# Patient Record
Sex: Female | Born: 1965
Health system: Southern US, Community
[De-identification: ages and names within clinical notes are randomized; demographics above are authoritative.]

## PROBLEM LIST (undated history)

## (undated) HISTORY — PX: BREAST BIOPSY: SHX20

## (undated) HISTORY — PX: REDUCTION MAMMAPLASTY: SUR839

---

## 1998-07-06 ENCOUNTER — Other Ambulatory Visit: Admission: RE | Admit: 1998-07-06 | Discharge: 1998-07-06 | Payer: Self-pay | Admitting: Obstetrics and Gynecology

## 1999-08-30 ENCOUNTER — Other Ambulatory Visit: Admission: RE | Admit: 1999-08-30 | Discharge: 1999-08-30 | Payer: Self-pay | Admitting: Obstetrics and Gynecology

## 2002-07-14 ENCOUNTER — Other Ambulatory Visit: Admission: RE | Admit: 2002-07-14 | Discharge: 2002-07-14 | Payer: Self-pay | Admitting: Obstetrics and Gynecology

## 2003-07-21 ENCOUNTER — Other Ambulatory Visit: Admission: RE | Admit: 2003-07-21 | Discharge: 2003-07-21 | Payer: Self-pay | Admitting: Obstetrics and Gynecology

## 2004-11-22 ENCOUNTER — Other Ambulatory Visit: Admission: RE | Admit: 2004-11-22 | Discharge: 2004-11-22 | Payer: Self-pay | Admitting: Obstetrics and Gynecology

## 2006-02-09 ENCOUNTER — Other Ambulatory Visit: Admission: RE | Admit: 2006-02-09 | Discharge: 2006-02-09 | Payer: Self-pay | Admitting: Obstetrics and Gynecology

## 2009-06-09 ENCOUNTER — Ambulatory Visit (HOSPITAL_BASED_OUTPATIENT_CLINIC_OR_DEPARTMENT_OTHER): Admission: RE | Admit: 2009-06-09 | Discharge: 2009-06-09 | Payer: Self-pay | Admitting: Surgery

## 2010-10-26 LAB — POCT HEMOGLOBIN-HEMACUE: Hemoglobin: 13.5 g/dL (ref 12.0–15.0)

## 2013-08-27 ENCOUNTER — Other Ambulatory Visit: Payer: Self-pay | Admitting: Obstetrics and Gynecology

## 2013-08-27 DIAGNOSIS — R928 Other abnormal and inconclusive findings on diagnostic imaging of breast: Secondary | ICD-10-CM

## 2013-09-08 ENCOUNTER — Ambulatory Visit
Admission: RE | Admit: 2013-09-08 | Discharge: 2013-09-08 | Disposition: A | Discharge status: Home / Self Care | Payer: BC Managed Care – PPO | Source: Ambulatory Visit | Attending: Obstetrics and Gynecology | Admitting: Obstetrics and Gynecology

## 2013-09-08 DIAGNOSIS — R928 Other abnormal and inconclusive findings on diagnostic imaging of breast: Secondary | ICD-10-CM

## 2014-02-23 ENCOUNTER — Other Ambulatory Visit: Payer: Self-pay | Admitting: Obstetrics and Gynecology

## 2014-02-23 DIAGNOSIS — N6320 Unspecified lump in the left breast, unspecified quadrant: Secondary | ICD-10-CM

## 2014-03-10 ENCOUNTER — Encounter (INDEPENDENT_AMBULATORY_CARE_PROVIDER_SITE_OTHER): Payer: Self-pay

## 2014-03-10 ENCOUNTER — Ambulatory Visit
Admission: RE | Admit: 2014-03-10 | Discharge: 2014-03-10 | Disposition: A | Discharge status: Home / Self Care | Payer: BC Managed Care – PPO | Source: Ambulatory Visit | Attending: Obstetrics and Gynecology | Admitting: Obstetrics and Gynecology

## 2014-03-10 DIAGNOSIS — N6320 Unspecified lump in the left breast, unspecified quadrant: Secondary | ICD-10-CM

## 2014-09-21 ENCOUNTER — Other Ambulatory Visit: Payer: Self-pay | Admitting: Obstetrics and Gynecology

## 2014-09-21 DIAGNOSIS — N63 Unspecified lump in unspecified breast: Secondary | ICD-10-CM

## 2014-09-29 ENCOUNTER — Ambulatory Visit
Admission: RE | Admit: 2014-09-29 | Discharge: 2014-09-29 | Disposition: A | Discharge status: Home / Self Care | Payer: BLUE CROSS/BLUE SHIELD | Source: Ambulatory Visit | Attending: Obstetrics and Gynecology | Admitting: Obstetrics and Gynecology

## 2014-09-29 DIAGNOSIS — N63 Unspecified lump in unspecified breast: Secondary | ICD-10-CM

## 2015-12-09 ENCOUNTER — Other Ambulatory Visit: Payer: Self-pay | Admitting: Obstetrics and Gynecology

## 2015-12-09 DIAGNOSIS — N6320 Unspecified lump in the left breast, unspecified quadrant: Secondary | ICD-10-CM

## 2015-12-15 ENCOUNTER — Ambulatory Visit
Admission: RE | Admit: 2015-12-15 | Discharge: 2015-12-15 | Disposition: A | Discharge status: Home / Self Care | Payer: BLUE CROSS/BLUE SHIELD | Source: Ambulatory Visit | Attending: Obstetrics and Gynecology | Admitting: Obstetrics and Gynecology

## 2015-12-15 DIAGNOSIS — N6320 Unspecified lump in the left breast, unspecified quadrant: Secondary | ICD-10-CM

## 2017-01-18 ENCOUNTER — Other Ambulatory Visit: Payer: Self-pay | Admitting: Obstetrics and Gynecology

## 2017-01-18 DIAGNOSIS — Z1231 Encounter for screening mammogram for malignant neoplasm of breast: Secondary | ICD-10-CM

## 2017-02-01 ENCOUNTER — Ambulatory Visit: Payer: BLUE CROSS/BLUE SHIELD

## 2017-02-15 ENCOUNTER — Ambulatory Visit
Admission: RE | Admit: 2017-02-15 | Discharge: 2017-02-15 | Disposition: A | Discharge status: Home / Self Care | Payer: BLUE CROSS/BLUE SHIELD | Source: Ambulatory Visit | Attending: Obstetrics and Gynecology | Admitting: Obstetrics and Gynecology

## 2017-02-15 ENCOUNTER — Encounter (INDEPENDENT_AMBULATORY_CARE_PROVIDER_SITE_OTHER): Payer: Self-pay

## 2017-02-15 DIAGNOSIS — Z1231 Encounter for screening mammogram for malignant neoplasm of breast: Secondary | ICD-10-CM

## 2017-05-15 LAB — HM PAP SMEAR

## 2017-10-24 DIAGNOSIS — Z139 Encounter for screening, unspecified: Secondary | ICD-10-CM | POA: Diagnosis not present

## 2018-04-02 DIAGNOSIS — M79672 Pain in left foot: Secondary | ICD-10-CM | POA: Diagnosis not present

## 2018-04-02 DIAGNOSIS — M7732 Calcaneal spur, left foot: Secondary | ICD-10-CM | POA: Diagnosis not present

## 2018-04-26 DIAGNOSIS — M25572 Pain in left ankle and joints of left foot: Secondary | ICD-10-CM | POA: Diagnosis not present

## 2018-05-10 ENCOUNTER — Other Ambulatory Visit: Payer: Self-pay | Admitting: Obstetrics and Gynecology

## 2018-05-10 DIAGNOSIS — Z1231 Encounter for screening mammogram for malignant neoplasm of breast: Secondary | ICD-10-CM

## 2018-05-14 DIAGNOSIS — N9089 Other specified noninflammatory disorders of vulva and perineum: Secondary | ICD-10-CM | POA: Diagnosis not present

## 2018-05-14 DIAGNOSIS — R300 Dysuria: Secondary | ICD-10-CM | POA: Diagnosis not present

## 2018-05-14 DIAGNOSIS — N898 Other specified noninflammatory disorders of vagina: Secondary | ICD-10-CM | POA: Diagnosis not present

## 2018-05-20 DIAGNOSIS — Z1389 Encounter for screening for other disorder: Secondary | ICD-10-CM | POA: Diagnosis not present

## 2018-05-20 DIAGNOSIS — Z6832 Body mass index (BMI) 32.0-32.9, adult: Secondary | ICD-10-CM | POA: Diagnosis not present

## 2018-05-20 DIAGNOSIS — Z01419 Encounter for gynecological examination (general) (routine) without abnormal findings: Secondary | ICD-10-CM | POA: Diagnosis not present

## 2018-05-20 DIAGNOSIS — Z124 Encounter for screening for malignant neoplasm of cervix: Secondary | ICD-10-CM | POA: Diagnosis not present

## 2018-05-21 DIAGNOSIS — Z124 Encounter for screening for malignant neoplasm of cervix: Secondary | ICD-10-CM | POA: Diagnosis not present

## 2018-05-21 DIAGNOSIS — Z01419 Encounter for gynecological examination (general) (routine) without abnormal findings: Secondary | ICD-10-CM | POA: Diagnosis not present

## 2018-06-17 ENCOUNTER — Ambulatory Visit: Payer: BLUE CROSS/BLUE SHIELD

## 2018-08-01 ENCOUNTER — Ambulatory Visit
Admission: RE | Admit: 2018-08-01 | Discharge: 2018-08-01 | Disposition: A | Discharge status: Home / Self Care | Payer: BLUE CROSS/BLUE SHIELD | Source: Ambulatory Visit | Attending: Obstetrics and Gynecology | Admitting: Obstetrics and Gynecology

## 2018-08-01 DIAGNOSIS — Z1231 Encounter for screening mammogram for malignant neoplasm of breast: Secondary | ICD-10-CM

## 2018-08-02 ENCOUNTER — Other Ambulatory Visit: Payer: Self-pay | Admitting: Obstetrics and Gynecology

## 2018-08-02 DIAGNOSIS — R928 Other abnormal and inconclusive findings on diagnostic imaging of breast: Secondary | ICD-10-CM

## 2018-08-07 ENCOUNTER — Ambulatory Visit: Payer: BLUE CROSS/BLUE SHIELD

## 2018-08-07 ENCOUNTER — Ambulatory Visit
Admission: RE | Admit: 2018-08-07 | Discharge: 2018-08-07 | Disposition: A | Discharge status: Home / Self Care | Payer: BLUE CROSS/BLUE SHIELD | Source: Ambulatory Visit | Attending: Obstetrics and Gynecology | Admitting: Obstetrics and Gynecology

## 2018-08-07 DIAGNOSIS — R928 Other abnormal and inconclusive findings on diagnostic imaging of breast: Secondary | ICD-10-CM

## 2019-06-12 DIAGNOSIS — Z20828 Contact with and (suspected) exposure to other viral communicable diseases: Secondary | ICD-10-CM | POA: Diagnosis not present

## 2019-06-23 DIAGNOSIS — Z1389 Encounter for screening for other disorder: Secondary | ICD-10-CM | POA: Diagnosis not present

## 2019-06-23 DIAGNOSIS — Z01419 Encounter for gynecological examination (general) (routine) without abnormal findings: Secondary | ICD-10-CM | POA: Diagnosis not present

## 2019-06-23 DIAGNOSIS — Z6834 Body mass index (BMI) 34.0-34.9, adult: Secondary | ICD-10-CM | POA: Diagnosis not present

## 2019-06-23 DIAGNOSIS — Z130 Encounter for screening for diseases of the blood and blood-forming organs and certain disorders involving the immune mechanism: Secondary | ICD-10-CM | POA: Diagnosis not present

## 2019-06-25 DIAGNOSIS — Z01419 Encounter for gynecological examination (general) (routine) without abnormal findings: Secondary | ICD-10-CM | POA: Diagnosis not present

## 2019-07-02 ENCOUNTER — Telehealth: Payer: Self-pay | Admitting: Internal Medicine

## 2019-07-02 NOTE — Telephone Encounter (Signed)
Patient is requesting to establish care with you. Would you be willing to see her as a new patient?  See message below.

## 2019-07-02 NOTE — Telephone Encounter (Signed)
Copied from [facility] [id number]. Topic: Appointment Scheduling - Scheduling Inquiry for Clinic >> Jul 02, 2019 11:08 AM Rainey Pines A wrote: Patient stated that her current primary doctor referred her to Dr.Burns as a new patient. Advised patient that Dr. Quay Burow wasn't accepting new patients. Patient insisted message be sent to see if an exception can be made based off referral. Please advise

## 2019-07-03 NOTE — Telephone Encounter (Signed)
I will accept her 

## 2019-07-04 NOTE — Telephone Encounter (Signed)
Appointment scheduled.

## 2019-07-08 ENCOUNTER — Other Ambulatory Visit: Payer: Self-pay

## 2019-07-08 ENCOUNTER — Encounter: Payer: Self-pay | Admitting: Internal Medicine

## 2019-07-08 ENCOUNTER — Ambulatory Visit (INDEPENDENT_AMBULATORY_CARE_PROVIDER_SITE_OTHER): Payer: BC Managed Care – PPO | Admitting: Internal Medicine

## 2019-07-08 DIAGNOSIS — R7989 Other specified abnormal findings of blood chemistry: Secondary | ICD-10-CM | POA: Insufficient documentation

## 2019-07-08 DIAGNOSIS — K219 Gastro-esophageal reflux disease without esophagitis: Secondary | ICD-10-CM

## 2019-07-08 DIAGNOSIS — R7303 Prediabetes: Secondary | ICD-10-CM

## 2019-07-08 DIAGNOSIS — F419 Anxiety disorder, unspecified: Secondary | ICD-10-CM

## 2019-07-08 DIAGNOSIS — E782 Mixed hyperlipidemia: Secondary | ICD-10-CM | POA: Diagnosis not present

## 2019-07-08 DIAGNOSIS — E569 Vitamin deficiency, unspecified: Secondary | ICD-10-CM

## 2019-07-08 DIAGNOSIS — E1169 Type 2 diabetes mellitus with other specified complication: Secondary | ICD-10-CM | POA: Insufficient documentation

## 2019-07-08 DIAGNOSIS — E785 Hyperlipidemia, unspecified: Secondary | ICD-10-CM | POA: Insufficient documentation

## 2019-07-08 DIAGNOSIS — F5101 Primary insomnia: Secondary | ICD-10-CM

## 2019-07-08 DIAGNOSIS — G4700 Insomnia, unspecified: Secondary | ICD-10-CM | POA: Insufficient documentation

## 2019-07-08 DIAGNOSIS — E559 Vitamin D deficiency, unspecified: Secondary | ICD-10-CM | POA: Insufficient documentation

## 2019-07-08 DIAGNOSIS — E119 Type 2 diabetes mellitus without complications: Secondary | ICD-10-CM | POA: Insufficient documentation

## 2019-07-08 MED ORDER — FAMOTIDINE 20 MG PO TABS: 20.0000 mg | ORAL_TABLET | Freq: Every day | ORAL | Status: DC

## 2019-07-08 MED ORDER — VENLAFAXINE HCL ER 37.5 MG PO CP24: 37.5000 mg | ORAL_CAPSULE | Freq: Every day | ORAL | 5 refills | Status: DC

## 2019-07-08 NOTE — Assessment & Plan Note (Signed)
New diagnosis Has been experiencing increased anxiety and has had some chest pressure which is likely related to the anxiety We will start Effexor 37.5 mg daily Discussed possible side effects Follow-up in office in 2 months, sooner if needed Encouraged regular exercise

## 2019-07-08 NOTE — Assessment & Plan Note (Signed)
Taking high-dose vitamin D-prescribed by GYN Advised to start taking 2000 units of vitamin D daily once she completes this

## 2019-07-08 NOTE — Assessment & Plan Note (Signed)
Primary insomnia Been prescribed Ambien by her gynecologist-takes 5 mg  4-5 days a week Insomnia worse since menopause GYN will continue to prescribe, but if they want me to prescribe I will

## 2019-07-08 NOTE — Assessment & Plan Note (Signed)
TSH elevated at [place] office recently-it was 6.13 ?  Subclinical hypothyroidism Discussed possibly starting medication versus rechecking TSH We will hold off on medication Recheck TSH in 2 months

## 2019-07-08 NOTE — Assessment & Plan Note (Signed)
A1c done by GYN was 6.2 Encourage regular exercise We will work on weight loss

## 2019-07-08 NOTE — Progress Notes (Signed)
Subjective:    Patient ID: Victoria Stout, female    DOB: 24-Jan-1966, 53 y.o.   MRN: [id number]  HPI  She is here to establish with a new pcp.    She does walk for exercise - at least one mile three times a week.   Chest pressure:  She has had chest pressure that she has had for a onmth ago.  There is no pattern to when it comes.  It is worse when she gets overwhelmed. She does not feel it when she is distracted - she feels it when she sits and thinks about things.    Anxiety:  She worries a lot.  She is more emotional.  She feels self conscious.  She was unsure if the chest pressure was related to anxiety or not.  Elevated TSH: She had recent blood work done by her gynecologist and was advised to follow-up with a PCP.  She has had difficulty losing weight.  Her hair has gotten a little bit thinner and she was not sure if that was related to the thyroid or menopause.  Prediabetes: This is not new.  Her sugar was elevated with her recent blood work.  She is doing some exercise.  She does eat more sweets than she should.  Hyperlipidemia: She has had slightly elevated cholesterol.  She is trying to control it with lifestyle.  Medications and allergies reviewed with patient and updated if appropriate.  Patient Active Problem List   Diagnosis Date Noted  . Vitamin deficiency 07/08/2019  . Prediabetes 07/08/2019  . Elevated TSH 07/08/2019  . Hyperlipidemia 07/08/2019  . Insomnia 07/08/2019  . GERD (gastroesophageal reflux disease) 07/08/2019  . Anxiety 07/08/2019    Current Outpatient Medications on File Prior to Visit  Medication Sig Dispense Refill  . Vitamin D, Ergocalciferol, (DRISDOL) 1.25 MG (50000 UT) CAPS capsule Take 50,000 Units by mouth every 7 (seven) days.    Marland Kitchen zolpidem (AMBIEN) 10 MG tablet Take 10 mg by mouth at bedtime as needed for sleep.     No current facility-administered medications on file prior to visit.    History reviewed. No pertinent past medical  history.  Past Surgical History:  Procedure Laterality Date  . BREAST BIOPSY Left   . REDUCTION MAMMAPLASTY Bilateral     Social History   Socioeconomic History  . Marital status: Single    Spouse name: Not on file  . Number of children: Not on file  . Years of education: Not on file  . Highest education level: Not on file  Occupational History  . Not on file  Tobacco Use  . Smoking status: Never Smoker  . Smokeless tobacco: Never Used  Substance and Sexual Activity  . Alcohol use: Not Currently  . Drug use: Never  . Sexual activity: Yes    Partners: Male  Other Topics Concern  . Not on file  Social History Narrative  . Not on file   Social Determinants of Health   Financial Resource Strain:   . Difficulty of Paying Living Expenses: Not on file  Food Insecurity:   . Worried About Charity fundraiser in the Last Year: Not on file  . Ran Out of Food in the Last Year: Not on file  Transportation Needs:   . Lack of Transportation (Medical): Not on file  . Lack of Transportation (Non-Medical): Not on file  Physical Activity:   . Days of Exercise per Week: Not on file  . Minutes of  Exercise per Session: Not on file  Stress:   . Feeling of Stress : Not on file  Social Connections:   . Frequency of Communication with Friends and Family: Not on file  . Frequency of Social Gatherings with Friends and Family: Not on file  . Attends Religious Services: Not on file  . Active Member of Clubs or Organizations: Not on file  . Attends Banker Meetings: Not on file  . Marital Status: Not on file    Family History  Problem Relation Age of Onset  . Cancer Mother   . Hypertension Mother   . Hyperlipidemia Mother   . Diabetes Father   . Cancer Father   . Hypertension Father   . Diabetes Sister   . Diabetes Brother   . Breast cancer Neg Hx     Review of Systems  Constitutional: Negative for chills and fever.       Hot flashes  Respiratory: Negative for  cough, shortness of breath and wheezing.   Cardiovascular: Positive for chest pain (chest pressure) and palpitations. Negative for leg swelling.  Gastrointestinal: Negative for abdominal pain, blood in stool, constipation, diarrhea and nausea.       GERD  Genitourinary: Negative for dysuria and hematuria.  Musculoskeletal: Positive for back pain (lower back) and myalgias (legs ache sometimes). Negative for arthralgias.  Neurological: Positive for headaches (occ). Negative for light-headedness.  Psychiatric/Behavioral: Positive for dysphoric mood (sometimes) and sleep disturbance. The patient is nervous/anxious.        Objective:   Vitals:   07/08/19 1055  BP: (!) 142/78  Pulse: 74  Resp: 16  Temp: 98.4 F (36.9 C)  SpO2: 99%   BP Readings from Last 3 Encounters:  07/08/19 (!) 142/78   Wt Readings from Last 3 Encounters:  07/08/19 212 lb (96.2 kg)   Body mass index is 36.39 kg/m.   Physical Exam Constitutional:      General: She is not in acute distress.    Appearance: Normal appearance. She is not ill-appearing.  HENT:     Head: Normocephalic and atraumatic.  Eyes:     Conjunctiva/sclera: Conjunctivae normal.  Neck:     Vascular: No carotid bruit.  Cardiovascular:     Rate and Rhythm: Normal rate and regular rhythm.     Pulses: Normal pulses.     Heart sounds: No murmur.  Pulmonary:     Effort: Pulmonary effort is normal. No respiratory distress.     Breath sounds: Normal breath sounds. No wheezing or rales.  Abdominal:     General: There is no distension.     Palpations: Abdomen is soft.     Tenderness: There is no abdominal tenderness.  Musculoskeletal:     Cervical back: Neck supple. No tenderness.     Right lower leg: No edema.     Left lower leg: No edema.  Lymphadenopathy:     Cervical: No cervical adenopathy.  Skin:    General: Skin is warm and dry.  Neurological:     Mental Status: She is alert.  Psychiatric:        Behavior: Behavior normal.          Thought Content: Thought content normal.        Judgment: Judgment normal.     Comments: Slightly anxious mood       .     Assessment & Plan:    See Problem List for Assessment and Plan of chronic medical problems.   This  visit occurred during the SARS-CoV-2 public health emergency.  Safety protocols were in place, including screening questions prior to the visit, additional usage of staff PPE, and extensive cleaning of exam room while observing appropriate contact time as indicated for disinfecting solutions.

## 2019-07-08 NOTE — Assessment & Plan Note (Signed)
Cholesterol elevated-recent LDL at [place] office 147, triglycerides 179, HDL 65, total cholesterol 244 Advised regular exercise Working on weight loss Can consider rechecking in a couple of months

## 2019-07-08 NOTE — Patient Instructions (Addendum)
   Medications reviewed and updated.  Changes include :   Effexor 37.5 mg daily  Your prescription(s) have been submitted to your pharmacy. Please take as directed and contact our office if you believe you are having problem(s) with the medication(s).   Please followup in 2 months

## 2019-07-08 NOTE — Assessment & Plan Note (Signed)
She started taking Pepcid 20 mg daily and will take daily for 2 weeks and then see if she needs it regularly or can take it as needed

## 2019-07-31 ENCOUNTER — Other Ambulatory Visit: Payer: Self-pay | Admitting: Internal Medicine

## 2019-08-03 ENCOUNTER — Encounter: Payer: Self-pay | Admitting: Internal Medicine

## 2019-09-07 NOTE — Progress Notes (Signed)
Subjective:    Patient ID: Victoria Stout, female    DOB: [birth year], 54 y.o.   MRN: [id number]  HPI The patient is here for follow up of their chronic medical problems, including anxiety, elevated tsh, prediabetes, GERD   We started effexor two months ago.  She denies any side effects.  She initially did not feel any different, but realized that she was less anxious and was not as irritable.  She does feel like the medication has helped.  She does feel anxious or stressed at times, but feels that may be normal.  She denies depression.  She is not exercising regularly.       Medications and allergies reviewed with patient and updated if appropriate.  Patient Active Problem List   Diagnosis Date Noted  . Vitamin deficiency 07/08/2019  . Prediabetes 07/08/2019  . Elevated TSH 07/08/2019  . Hyperlipidemia 07/08/2019  . Insomnia 07/08/2019  . GERD (gastroesophageal reflux disease) 07/08/2019  . Anxiety 07/08/2019    Current Outpatient Medications on File Prior to Visit  Medication Sig Dispense Refill  . omeprazole (PRILOSEC) 20 MG capsule Take 20 mg by mouth daily.    Marland Kitchen venlafaxine XR (EFFEXOR-XR) 37.5 MG 24 hr capsule TAKE 1 CAPSULE BY MOUTH DAILY WITH BREAKFAST. 90 capsule 2  . Vitamin D, Ergocalciferol, (DRISDOL) 1.25 MG (50000 UT) CAPS capsule Take 50,000 Units by mouth every 7 (seven) days.    Marland Kitchen zolpidem (AMBIEN) 10 MG tablet Take 10 mg by mouth at bedtime as needed for sleep.     No current facility-administered medications on file prior to visit.    History reviewed. No pertinent past medical history.  Past Surgical History:  Procedure Laterality Date  . BREAST BIOPSY Left   . REDUCTION MAMMAPLASTY Bilateral     Social History   Socioeconomic History  . Marital status: Single    Spouse name: Not on file  . Number of children: Not on file  . Years of education: Not on file  . Highest education level: Not on file  Occupational History  . Not on file    Tobacco Use  . Smoking status: Never Smoker  . Smokeless tobacco: Never Used  Substance and Sexual Activity  . Alcohol use: Not Currently  . Drug use: Never  . Sexual activity: Yes    Partners: Male  Other Topics Concern  . Not on file  Social History Narrative  . Not on file   Social Determinants of Health   Financial Resource Strain:   . Difficulty of Paying Living Expenses: Not on file  Food Insecurity:   . Worried About Charity fundraiser in the Last Year: Not on file  . Ran Out of Food in the Last Year: Not on file  Transportation Needs:   . Lack of Transportation (Medical): Not on file  . Lack of Transportation (Non-Medical): Not on file  Physical Activity:   . Days of Exercise per Week: Not on file  . Minutes of Exercise per Session: Not on file  Stress:   . Feeling of Stress : Not on file  Social Connections:   . Frequency of Communication with Friends and Family: Not on file  . Frequency of Social Gatherings with Friends and Family: Not on file  . Attends Religious Services: Not on file  . Active Member of Clubs or Organizations: Not on file  . Attends Archivist Meetings: Not on file  . Marital Status: Not on file  Family History  Problem Relation Age of Onset  . Cancer Mother   . Hypertension Mother   . Hyperlipidemia Mother   . Diabetes Father   . Cancer Father   . Hypertension Father   . Diabetes Sister   . Diabetes Brother   . Breast cancer Neg Hx     Review of Systems  Constitutional: Negative for chills, fatigue and fever.  Respiratory: Negative for shortness of breath.   Cardiovascular: Negative for chest pain and palpitations.  Gastrointestinal: Negative for abdominal pain and nausea.  Neurological: Negative for light-headedness and headaches.       Objective:   Vitals:   09/08/19 1110  BP: 136/82  Pulse: 70  Resp: 16  Temp: 98.4 F (36.9 C)  SpO2: 98%   BP Readings from Last 3 Encounters:  09/08/19 136/82   07/08/19 (!) 142/78   Wt Readings from Last 3 Encounters:  09/08/19 215 lb (97.5 kg)  07/08/19 212 lb (96.2 kg)   Body mass index is 36.9 kg/m.   Physical Exam    Constitutional: Appears well-developed and well-nourished. No distress.  HENT:  Head: Normocephalic and atraumatic.  Neck: Neck supple. No tracheal deviation present. No thyromegaly present.  No cervical lymphadenopathy Cardiovascular: Normal rate, regular rhythm and normal heart sounds.   No murmur heard. No carotid bruit .  No edema Pulmonary/Chest: Effort normal and breath sounds normal. No respiratory distress. No has no wheezes. No rales.  Skin: Skin is warm and dry. Not diaphoretic.  Psychiatric: Normal mood and affect. Behavior is normal.      Assessment & Plan:    30 minutes were spent face-to-face with the patient, over 50% of which was spent counseling regarding her borderline elevated blood pressure, prediabetes and hyperlipidemia.  Discussed the importance of lifestyle changes including regular exercise, weight loss and healthy diet to help improve these medical problems and avoid more medication in the future    See Problem List for Assessment and Plan of chronic medical problems.    This visit occurred during the SARS-CoV-2 public health emergency.  Safety protocols were in place, including screening questions prior to the visit, additional usage of staff PPE, and extensive cleaning of exam room while observing appropriate contact time as indicated for disinfecting solutions.

## 2019-09-08 ENCOUNTER — Other Ambulatory Visit: Payer: Self-pay

## 2019-09-08 ENCOUNTER — Encounter: Payer: Self-pay | Admitting: Internal Medicine

## 2019-09-08 ENCOUNTER — Ambulatory Visit: Payer: BC Managed Care – PPO | Admitting: Internal Medicine

## 2019-09-08 VITALS — BP 136/82 | HR 70 | Temp 98.4°F | Resp 16 | Ht 64.0 in | Wt 215.0 lb

## 2019-09-08 DIAGNOSIS — R7989 Other specified abnormal findings of blood chemistry: Secondary | ICD-10-CM | POA: Diagnosis not present

## 2019-09-08 DIAGNOSIS — F419 Anxiety disorder, unspecified: Secondary | ICD-10-CM

## 2019-09-08 DIAGNOSIS — E569 Vitamin deficiency, unspecified: Secondary | ICD-10-CM

## 2019-09-08 DIAGNOSIS — Z23 Encounter for immunization: Secondary | ICD-10-CM

## 2019-09-08 DIAGNOSIS — R7303 Prediabetes: Secondary | ICD-10-CM | POA: Diagnosis not present

## 2019-09-08 DIAGNOSIS — K219 Gastro-esophageal reflux disease without esophagitis: Secondary | ICD-10-CM

## 2019-09-08 LAB — T3, FREE: T3, Free: 3.3 pg/mL (ref 2.3–4.2)

## 2019-09-08 LAB — T4, FREE: Free T4: 0.61 ng/dL (ref 0.60–1.60)

## 2019-09-08 LAB — TSH: TSH: 2.78 u[IU]/mL (ref 0.35–4.50)

## 2019-09-08 NOTE — Assessment & Plan Note (Signed)
Improved Anxiety seems to be well controlled We will continue Effexor 37.5 mg daily Discussed that anytime if she wants to try the higher dose to see if that is more effective she can let me know and we will try it Follow-up in 6 months

## 2019-09-08 NOTE — Patient Instructions (Addendum)
°  Blood work was ordered.   ° ° °Medications reviewed and updated.  Changes include :   none ° ° ° °Please followup in 6 months ° ° °

## 2019-09-08 NOTE — Assessment & Plan Note (Signed)
Taking high-dose vitamin D now When she completes this prescription advised to restart vitamin D 2000 units daily Will check vitamin D level in 6 months

## 2019-09-08 NOTE — Assessment & Plan Note (Signed)
Takes omeprazole prn - about twice a week pepcid not effective Continue prilosec

## 2019-09-08 NOTE — Assessment & Plan Note (Signed)
TSH elevated Clinically euthyroid Will check TFTs today and consider treatment if needed

## 2019-09-08 NOTE — Assessment & Plan Note (Addendum)
Chronic Discussed the importance of a low sugar/carbohydrate diet Encouraged regular exercise Advised weight loss Counseled on the importance of the exercise, diet and weight loss to avoid diabetes Will check A1c in 6 months

## 2019-10-07 ENCOUNTER — Telehealth: Payer: Self-pay | Admitting: Internal Medicine

## 2019-10-07 MED ORDER — VENLAFAXINE HCL ER 37.5 MG PO CP24: ORAL_CAPSULE | ORAL | 2 refills | Status: DC

## 2019-10-07 NOTE — Telephone Encounter (Signed)
New message:   1.Medication Requested: venlafaxine XR (EFFEXOR-XR) 37.5 MG 24 hr capsule 2. Pharmacy (Name, Street, [place]): [phone] [place], [place] - [phone] 3. On Med List: Yes  4. Last Visit with PCP:   5. Next visit date with PCP:  Pt states she discussed with Dr. Lawerance Bach about doubling the dosage of this medication if she would like to so she is stating she is now ready to double that dosage. Agent: Please be advised that RX refills may take up to 3 business days. We ask that you follow-up with your pharmacy.

## 2019-10-13 MED ORDER — VENLAFAXINE HCL ER 75 MG PO CP24: 75.0000 mg | ORAL_CAPSULE | Freq: Every day | ORAL | 1 refills | Status: DC

## 2019-10-13 NOTE — Telephone Encounter (Signed)
Can he increase?

## 2019-10-13 NOTE — Telephone Encounter (Signed)
Patient calling and states that she was wanting to increase her dose. States that she had discussed this with Dr Lawerance Bach, but was not ready to increase at that time. New refill was sent as original prescription. Please advise.

## 2019-10-13 NOTE — Telephone Encounter (Signed)
Ok to increase - take 2 pills at the same time until she uses up what she has.  Venlafaxine 75 mg [facility] to [facility].

## 2019-10-13 NOTE — Addendum Note (Signed)
Addended by: Pincus Sanes on: 10/13/2019 04:39 PM   Modules accepted: Orders

## 2019-10-14 NOTE — Telephone Encounter (Signed)
Pt aware of response.  

## 2020-01-02 IMAGING — MG DIGITAL DIAGNOSTIC UNILATERAL RIGHT MAMMOGRAM WITH TOMO AND CAD
4 series · 4 of 12 positions shown · non-contrast
Comparison: 08/01/2018

Addendum:
CLINICAL DATA: Patient returns for further evaluation of possible
RIGHT breast asymmetry detected on 2D screening.

EXAM:
DIGITAL DIAGNOSTIC UNILATERAL RIGHT MAMMOGRAM WITH CAD AND TOMO

[R MLO synth-2D]
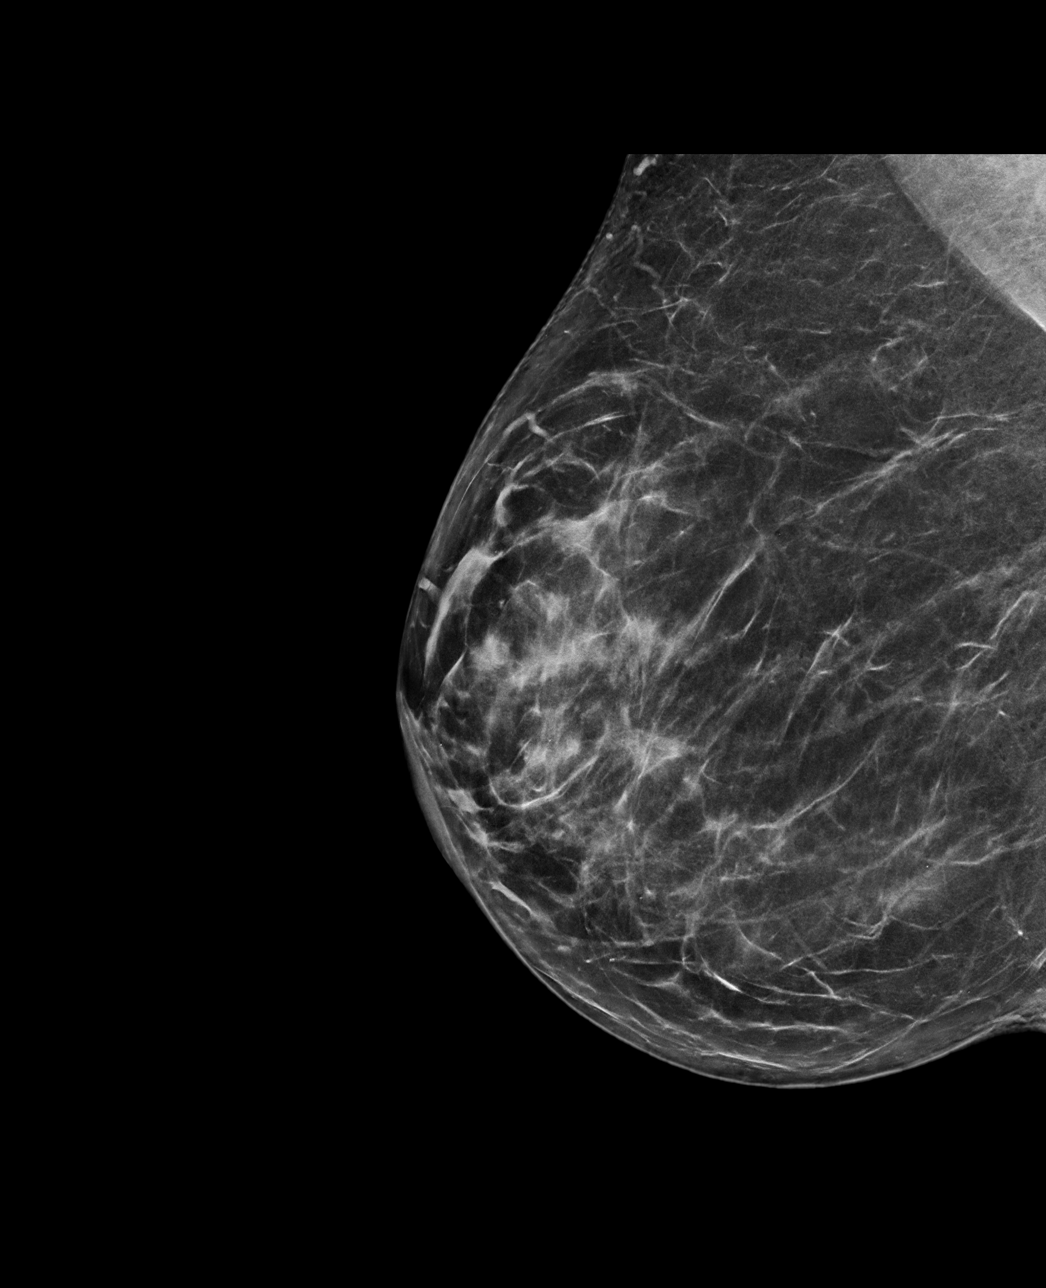

[R CC synth-2D]
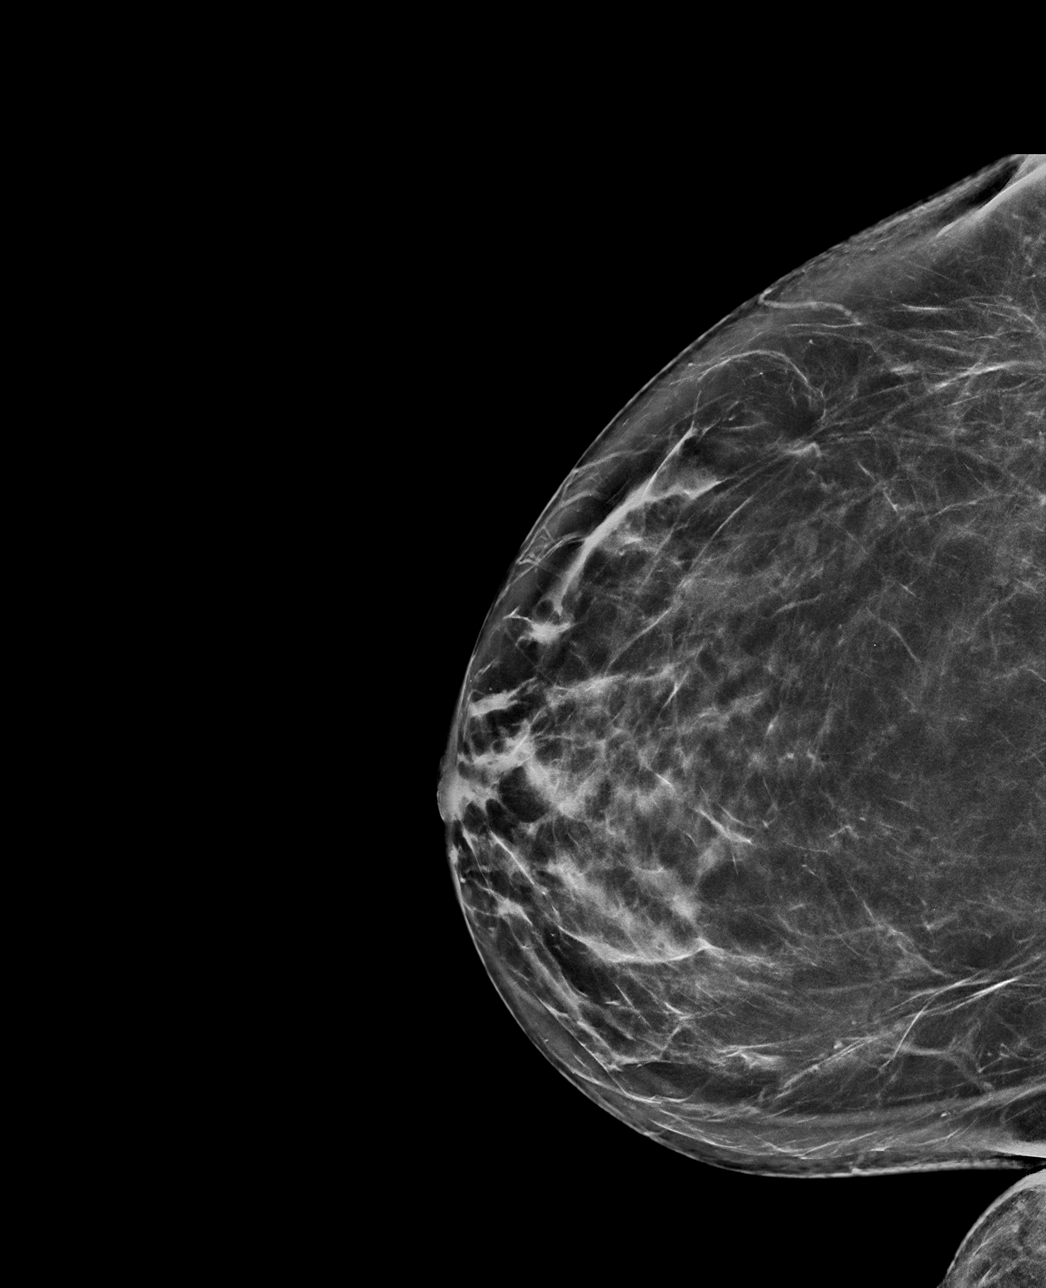

[R MLO tomo · tomo slice 41/82.0]
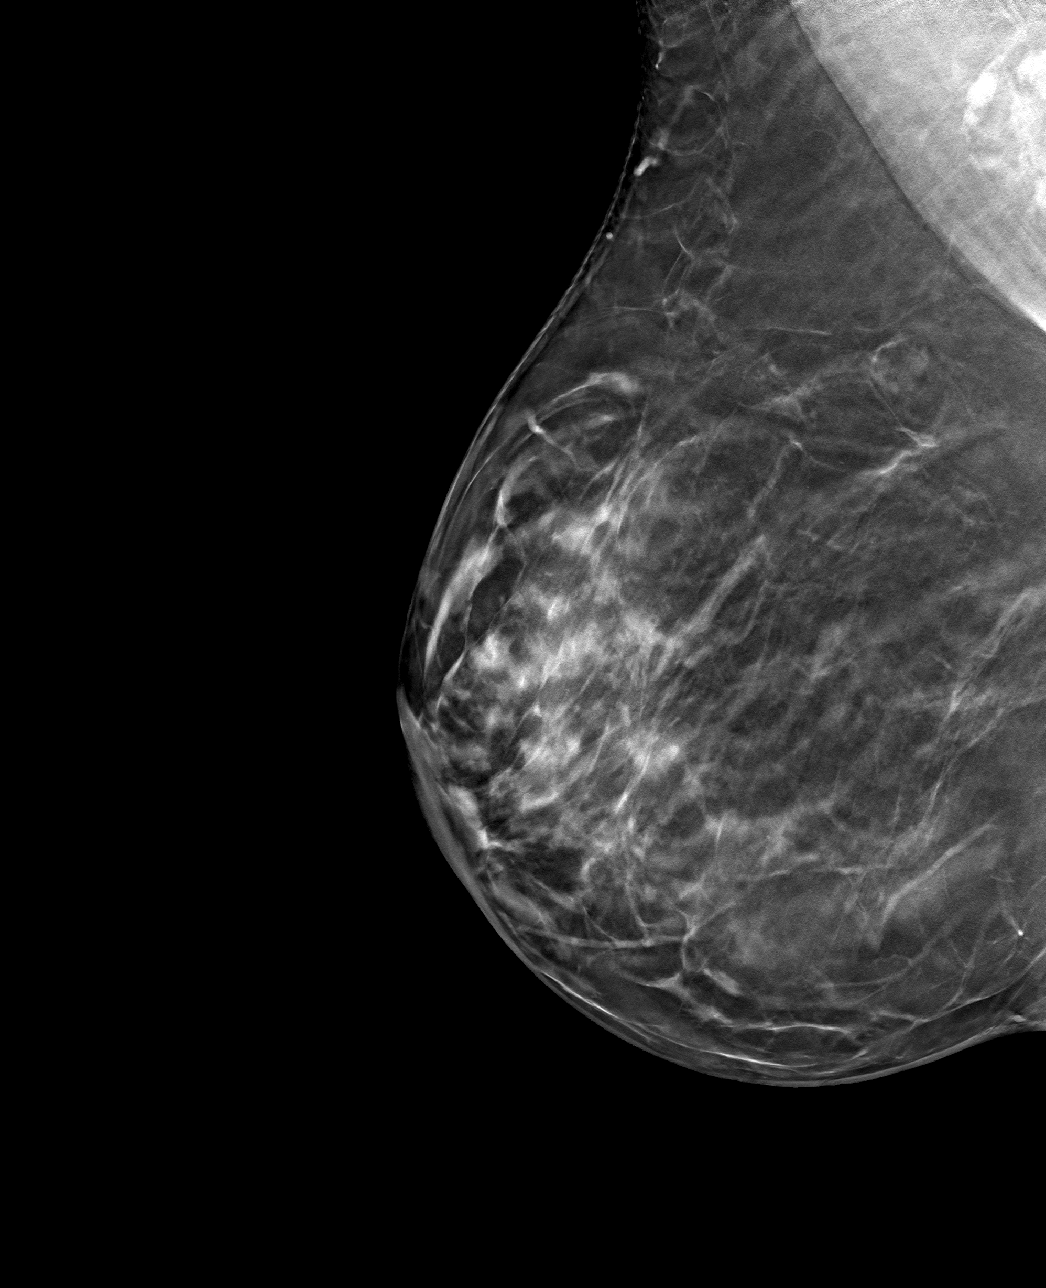

[R CC tomo · tomo slice 41/81.0]
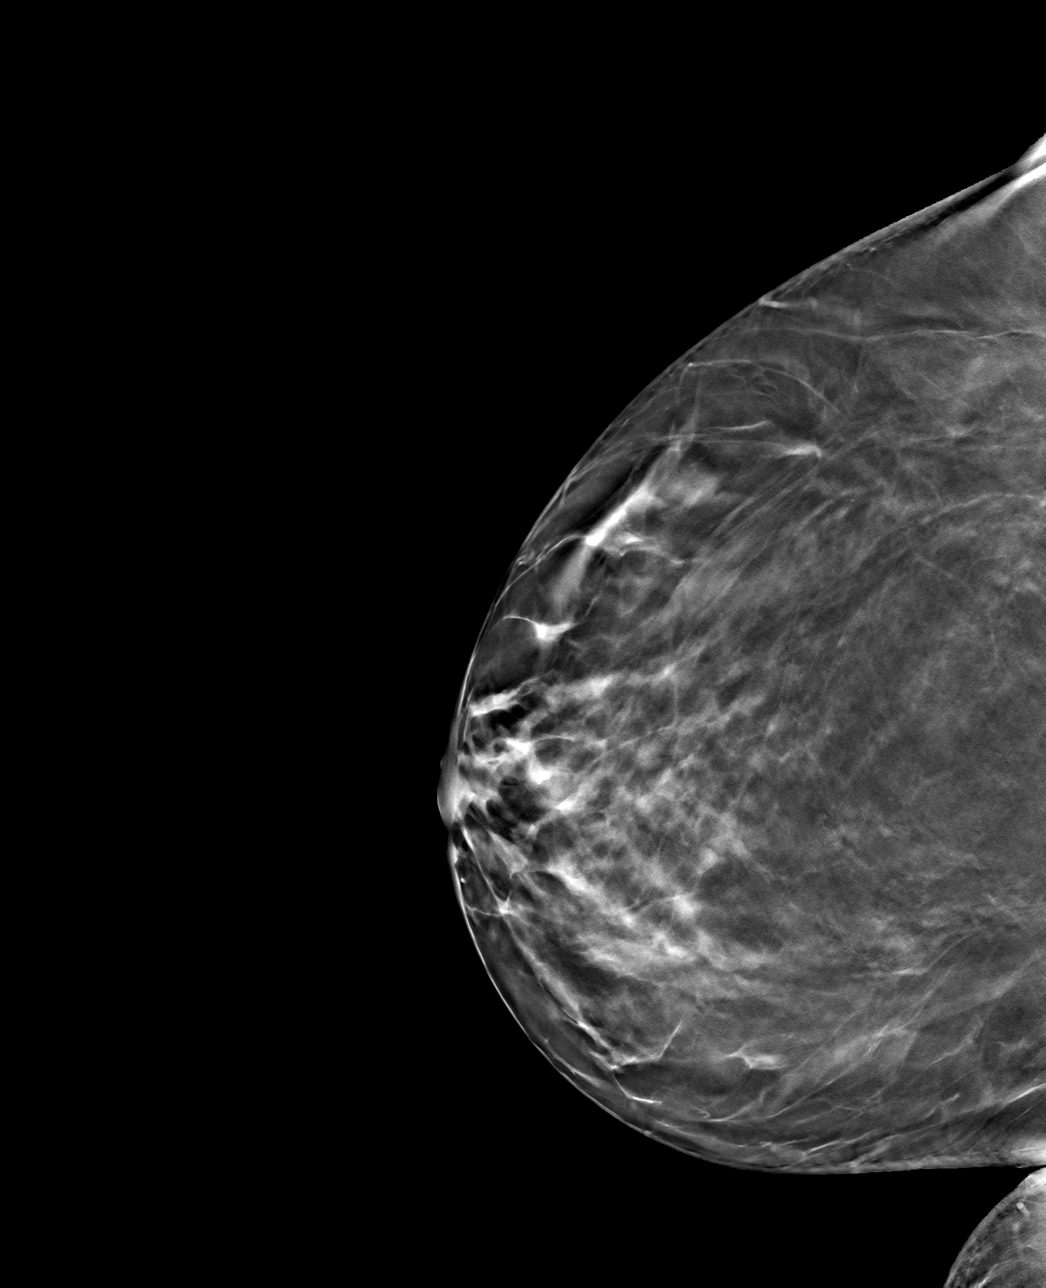

[4 of 12 positions shown; findings below may reference images not displayed]

ACR Breast Density Category c: The breast tissue is heterogeneously
dense, which may obscure small masses.
FINDINGS: Additional 2-D and 3-D images are performed. No persistent asymmetry
or mass identified in the LATERAL aspect of the RIGHT breast.

Mammographic images were processed with CAD.
IMPRESSION: No suspicious mass, distortion, or microcalcifications are
identified to suggest presence of malignancy.

RECOMMENDATION:
No mammographic evidence for malignancy.

I have discussed the findings and recommendations with the patient.
Results were also provided in writing at the conclusion of the
visit. If applicable, a reminder letter will be sent to the patient
regarding the next appointment.

BI-RADS CATEGORY  1: Negative.

ADDENDUM:
Corrected report:
IMPRESSION: No mammographic evidence for malignancy.

Recommendation:

Screening mammogram is recommended in July 2019.

BI-RADS category 1: Negative.

*** End of Addendum ***

## 2020-02-18 ENCOUNTER — Other Ambulatory Visit: Payer: Self-pay | Admitting: Internal Medicine

## 2020-02-18 DIAGNOSIS — Z1231 Encounter for screening mammogram for malignant neoplasm of breast: Secondary | ICD-10-CM

## 2020-02-26 ENCOUNTER — Other Ambulatory Visit: Payer: Self-pay

## 2020-02-26 ENCOUNTER — Ambulatory Visit
Admission: RE | Admit: 2020-02-26 | Discharge: 2020-02-26 | Disposition: A | Discharge status: Home / Self Care | Payer: BC Managed Care – PPO | Source: Ambulatory Visit | Attending: Internal Medicine | Admitting: Internal Medicine

## 2020-02-26 DIAGNOSIS — Z1231 Encounter for screening mammogram for malignant neoplasm of breast: Secondary | ICD-10-CM | POA: Diagnosis not present

## 2020-03-07 NOTE — Patient Instructions (Addendum)
Blood work was ordered.    All other Health Maintenance issues reviewed.   All recommended immunizations and age-appropriate screenings are up-to-date or discussed.  No immunization administered today.   Medications reviewed and updated.  Changes include :   Increase effexor to 150 mg daily  Your prescription(s) have been submitted to your pharmacy. Please take as directed and contact our office if you believe you are having problem(s) with the medication(s).    Please followup in 1 year     Health Maintenance, Female Adopting a healthy lifestyle and getting preventive care are important in promoting health and wellness. Ask your health care provider about:  The right schedule for you to have regular tests and exams.  Things you can do on your own to prevent diseases and keep yourself healthy. What should I know about diet, weight, and exercise? Eat a healthy diet   Eat a diet that includes plenty of vegetables, fruits, low-fat dairy products, and lean protein.  Do not eat a lot of foods that are high in solid fats, added sugars, or sodium. Maintain a healthy weight Body mass index (BMI) is used to identify weight problems. It estimates body fat based on height and weight. Your health care provider can help determine your BMI and help you achieve or maintain a healthy weight. Get regular exercise Get regular exercise. This is one of the most important things you can do for your health. Most adults should:  Exercise for at least 150 minutes each week. The exercise should increase your heart rate and make you sweat (moderate-intensity exercise).  Do strengthening exercises at least twice a week. This is in addition to the moderate-intensity exercise.  Spend less time sitting. Even light physical activity can be beneficial. Watch cholesterol and blood lipids Have your blood tested for lipids and cholesterol at 55 years of age, then have this test every 5 years. Have your  cholesterol levels checked more often if:  Your lipid or cholesterol levels are high.  You are older than 55 years of age.  You are at high risk for heart disease. What should I know about cancer screening? Depending on your health history and family history, you may need to have cancer screening at various ages. This may include screening for:  Breast cancer.  Cervical cancer.  Colorectal cancer.  Skin cancer.  Lung cancer. What should I know about heart disease, diabetes, and high blood pressure? Blood pressure and heart disease  High blood pressure causes heart disease and increases the risk of stroke. This is more likely to develop in people who have high blood pressure readings, are of African descent, or are overweight.  Have your blood pressure checked: ? Every 3-5 years if you are 80-67 years of age. ? Every year if you are 7 years old or older. Diabetes Have regular diabetes screenings. This checks your fasting blood sugar level. Have the screening done:  Once every three years after age 67 if you are at a normal weight and have a low risk for diabetes.  More often and at a younger age if you are overweight or have a high risk for diabetes. What should I know about preventing infection? Hepatitis B If you have a higher risk for hepatitis B, you should be screened for this virus. Talk with your health care provider to find out if you are at risk for hepatitis B infection. Hepatitis C Testing is recommended for:  Everyone born from [birth year] through 1966.  Anyone  with known risk factors for hepatitis C. Sexually transmitted infections (STIs)  Get screened for STIs, including gonorrhea and chlamydia, if: ? You are sexually active and are younger than 55 years of age. ? You are older than 54 years of age and your health care provider tells you that you are at risk for this type of infection. ? Your sexual activity has changed since you were last screened, and you are  at increased risk for chlamydia or gonorrhea. Ask your health care provider if you are at risk.  Ask your health care provider about whether you are at high risk for HIV. Your health care provider may recommend a prescription medicine to help prevent HIV infection. If you choose to take medicine to prevent HIV, you should first get tested for HIV. You should then be tested every 3 months for as long as you are taking the medicine. Pregnancy  If you are about to stop having your period (premenopausal) and you may become pregnant, seek counseling before you get pregnant.  Take 400 to 800 micrograms (mcg) of folic acid every day if you become pregnant.  Ask for birth control (contraception) if you want to prevent pregnancy. Osteoporosis and menopause Osteoporosis is a disease in which the bones lose minerals and strength with aging. This can result in bone fractures. If you are 65 years old or older, or if you are at risk for osteoporosis and fractures, ask your health care provider if you should:  Be screened for bone loss.  Take a calcium or vitamin D supplement to lower your risk of fractures.  Be given hormone replacement therapy (HRT) to treat symptoms of menopause. Follow these instructions at home: Lifestyle  Do not use any products that contain nicotine or tobacco, such as cigarettes, e-cigarettes, and chewing tobacco. If you need help quitting, ask your health care provider.  Do not use street drugs.  Do not share needles.  Ask your health care provider for help if you need support or information about quitting drugs. Alcohol use  Do not drink alcohol if: ? Your health care provider tells you not to drink. ? You are pregnant, may be pregnant, or are planning to become pregnant.  If you drink alcohol: ? Limit how much you use to 0-1 drink a day. ? Limit intake if you are breastfeeding.  Be aware of how much alcohol is in your drink. In the [place] one drink equals one 12 oz  bottle of beer (355 mL), one 5 oz glass of wine (148 mL), or one 1 oz glass of hard liquor (44 mL). General instructions  Schedule regular health, dental, and eye exams.  Stay current with your vaccines.  Tell your health care provider if: ? You often feel depressed. ? You have ever been abused or do not feel safe at home. Summary  Adopting a healthy lifestyle and getting preventive care are important in promoting health and wellness.  Follow your health care provider's instructions about healthy diet, exercising, and getting tested or screened for diseases.  Follow your health care provider's instructions on monitoring your cholesterol and blood pressure. This information is not intended to replace advice given to you by your health care provider. Make sure you discuss any questions you have with your health care provider. Document Revised: 07/03/2018 Document Reviewed: 07/03/2018 Elsevier Patient Education  2020 [employer].

## 2020-03-07 NOTE — Progress Notes (Signed)
Subjective:    Patient ID: Victoria Stout, female    DOB: 1965/09/29, 54 y.o.   MRN: [id number]  HPI She is here for a physical exam.   She has no big concerns, except her anxiety is not as well controlled.  She has been having some mild anxiety attacks.  She feels her heart racing when this occurs. She does some deep breathing and this helps.  She feels more overwhelmed at times.    Medications and allergies reviewed with patient and updated if appropriate.  Patient Active Problem List   Diagnosis Date Noted  . Vitamin deficiency 07/08/2019  . Prediabetes 07/08/2019  . Elevated TSH 07/08/2019  . Hyperlipidemia 07/08/2019  . Insomnia 07/08/2019  . GERD (gastroesophageal reflux disease) 07/08/2019  . Anxiety 07/08/2019    Current Outpatient Medications on File Prior to Visit  Medication Sig Dispense Refill  . Cholecalciferol (VITAMIN D) 50 MCG (2000 UT) CAPS Take by mouth.    Marland Kitchen omeprazole (PRILOSEC) 20 MG capsule Take 20 mg by mouth daily.    Marland Kitchen zolpidem (AMBIEN) 10 MG tablet Take 10 mg by mouth at bedtime as needed for sleep.     No current facility-administered medications on file prior to visit.    History reviewed. No pertinent past medical history.  Past Surgical History:  Procedure Laterality Date  . BREAST BIOPSY Left   . REDUCTION MAMMAPLASTY Bilateral     Social History   Socioeconomic History  . Marital status: Married    Spouse name: Not on file  . Number of children: Not on file  . Years of education: Not on file  . Highest education level: Not on file  Occupational History  . Not on file  Tobacco Use  . Smoking status: Never Smoker  . Smokeless tobacco: Never Used  Substance and Sexual Activity  . Alcohol use: Not Currently  . Drug use: Never  . Sexual activity: Yes    Partners: Male  Other Topics Concern  . Not on file  Social History Narrative  . Not on file   Social Determinants of Health   Financial Resource Strain:   .  Difficulty of Paying Living Expenses:   Food Insecurity:   . Worried About Programme researcher, broadcasting/film/video in the Last Year:   . Barista in the Last Year:   Transportation Needs:   . Freight forwarder (Medical):   Marland Kitchen Lack of Transportation (Non-Medical):   Physical Activity:   . Days of Exercise per Week:   . Minutes of Exercise per Session:   Stress:   . Feeling of Stress :   Social Connections:   . Frequency of Communication with Friends and Family:   . Frequency of Social Gatherings with Friends and Family:   . Attends Religious Services:   . Active Member of Clubs or Organizations:   . Attends Banker Meetings:   Marland Kitchen Marital Status:     Family History  Problem Relation Age of Onset  . Cancer Mother   . Hypertension Mother   . Hyperlipidemia Mother   . Diabetes Father   . Cancer Father   . Hypertension Father   . Diabetes Sister   . Diabetes Brother   . Breast cancer Neg Hx     Review of Systems  Constitutional: Negative for chills, fatigue and fever.  Eyes: Negative for visual disturbance.  Respiratory: Negative for cough, shortness of breath and wheezing.   Cardiovascular: Positive for palpitations (  with anxiety - more racing). Negative for chest pain and leg swelling.  Gastrointestinal: Negative for abdominal pain, blood in stool, constipation, diarrhea and nausea.  Genitourinary: Negative for dysuria and hematuria.  Musculoskeletal: Positive for back pain. Negative for arthralgias.  Skin: Negative for rash.  Neurological: Negative for light-headedness and headaches.  Psychiatric/Behavioral: Negative for dysphoric mood. The patient is nervous/anxious.        Objective:   Vitals:   03/08/20 1124  BP: 136/78  Pulse: 97  Temp: 98 F (36.7 C)  SpO2: 99%   Filed Weights   03/08/20 1124  Weight: 220 lb (99.8 kg)   Body mass index is 37.76 kg/m.  BP Readings from Last 3 Encounters:  03/08/20 136/78  09/08/19 136/82  07/08/19 (!) 142/78     Wt Readings from Last 3 Encounters:  03/08/20 220 lb (99.8 kg)  09/08/19 215 lb (97.5 kg)  07/08/19 212 lb (96.2 kg)     Physical Exam Constitutional: She appears well-developed and well-nourished. No distress.  HENT:  Head: Normocephalic and atraumatic.  Right Ear: External ear normal. Normal ear canal and TM Left Ear: External ear normal.  Normal ear canal and TM Mouth/Throat: Oropharynx is clear and moist.  Eyes: Conjunctivae and EOM are normal.  Neck: Neck supple. No tracheal deviation present. No thyromegaly present.  No carotid bruit  Cardiovascular: Normal rate, regular rhythm and normal heart sounds.   No murmur heard.  No edema. Pulmonary/Chest: Effort normal and breath sounds normal. No respiratory distress. She has no wheezes. She has no rales.  Breast: deferred   Abdominal: Soft. She exhibits no distension. There is no tenderness.  Lymphadenopathy: She has no cervical adenopathy.  Skin: Skin is warm and dry. She is not diaphoretic.  Psychiatric: She has a normal mood and affect. Her behavior is normal.        Assessment & Plan:   Physical exam: Screening blood work    ordered Immunizations  Second covid vaccine, discussed shingrix Colonoscopy  Had it at 1 - due next year Mammogram  Up to date  Gyn  Up to date  Eye exams  Due - will schedule Exercise  Walks minimally Weight  Advised weight loss Substance abuse  none  See Problem List for Assessment and Plan of chronic medical problems.   This visit occurred during the SARS-CoV-2 public health emergency.  Safety protocols were in place, including screening questions prior to the visit, additional usage of staff PPE, and extensive cleaning of exam room while observing appropriate contact time as indicated for disinfecting solutions.

## 2020-03-08 ENCOUNTER — Ambulatory Visit (INDEPENDENT_AMBULATORY_CARE_PROVIDER_SITE_OTHER): Payer: BC Managed Care – PPO | Admitting: Internal Medicine

## 2020-03-08 ENCOUNTER — Other Ambulatory Visit: Payer: Self-pay

## 2020-03-08 ENCOUNTER — Encounter: Payer: Self-pay | Admitting: Internal Medicine

## 2020-03-08 VITALS — BP 136/78 | HR 97 | Temp 98.0°F | Ht 64.0 in | Wt 220.0 lb

## 2020-03-08 DIAGNOSIS — Z0000 Encounter for general adult medical examination without abnormal findings: Secondary | ICD-10-CM | POA: Diagnosis not present

## 2020-03-08 DIAGNOSIS — R7303 Prediabetes: Secondary | ICD-10-CM | POA: Diagnosis not present

## 2020-03-08 DIAGNOSIS — E569 Vitamin deficiency, unspecified: Secondary | ICD-10-CM

## 2020-03-08 DIAGNOSIS — K219 Gastro-esophageal reflux disease without esophagitis: Secondary | ICD-10-CM

## 2020-03-08 DIAGNOSIS — R7989 Other specified abnormal findings of blood chemistry: Secondary | ICD-10-CM

## 2020-03-08 DIAGNOSIS — F419 Anxiety disorder, unspecified: Secondary | ICD-10-CM

## 2020-03-08 DIAGNOSIS — E782 Mixed hyperlipidemia: Secondary | ICD-10-CM | POA: Diagnosis not present

## 2020-03-08 DIAGNOSIS — F5101 Primary insomnia: Secondary | ICD-10-CM

## 2020-03-08 MED ORDER — VENLAFAXINE HCL ER 150 MG PO CP24: 150.0000 mg | ORAL_CAPSULE | Freq: Every day | ORAL | 3 refills | Status: DC

## 2020-03-08 NOTE — Assessment & Plan Note (Signed)
Recheck tsh

## 2020-03-08 NOTE — Assessment & Plan Note (Signed)
Chronic GERD controlled Occasionally feels lump in chest - maybe once a week or less Continue daily medication - take an extra omeprazole or try tums

## 2020-03-08 NOTE — Assessment & Plan Note (Signed)
Chronic Controlled, stable Continue current dose of medication  

## 2020-03-08 NOTE — Assessment & Plan Note (Signed)
Chronic Check lipid panel, cmp Regular exercise and healthy diet encouraged

## 2020-03-08 NOTE — Assessment & Plan Note (Signed)
Chronic Taking 2000 units of vitamin d daily Check level

## 2020-03-08 NOTE — Assessment & Plan Note (Signed)
Chronic Still has some anxiety  - feels heart racing and has to take deep breaths Feels more stressed and gets overwhelmed at times Increase effexor to 150 mg daily

## 2020-03-08 NOTE — Assessment & Plan Note (Signed)
Chronic Check a1c Low sugar / carb diet Stressed regular exercise  

## 2020-03-09 LAB — CBC WITH DIFFERENTIAL/PLATELET
Absolute Monocytes: 422 cells/uL (ref 200–950)
Basophils Absolute: 32 cells/uL (ref 0–200)
Basophils Relative: 0.5 %
Eosinophils Absolute: 179 cells/uL (ref 15–500)
Eosinophils Relative: 2.8 %
HCT: 42.1 % (ref 35.0–45.0)
Hemoglobin: 13.3 g/dL (ref 11.7–15.5)
Lymphs Abs: 2368 cells/uL (ref 850–3900)
MCH: 28.2 pg (ref 27.0–33.0)
MCHC: 31.6 g/dL — ABNORMAL LOW (ref 32.0–36.0)
MCV: 89.4 fL (ref 80.0–100.0)
MPV: 11.6 fL (ref 7.5–12.5)
Monocytes Relative: 6.6 %
Neutro Abs: 3398 cells/uL (ref 1500–7800)
Neutrophils Relative %: 53.1 %
Platelets: 256 10*3/uL (ref 140–400)
RBC: 4.71 10*6/uL (ref 3.80–5.10)
RDW: 13.4 % (ref 11.0–15.0)
Total Lymphocyte: 37 %
WBC: 6.4 10*3/uL (ref 3.8–10.8)

## 2020-03-09 LAB — COMPREHENSIVE METABOLIC PANEL
AG Ratio: 1.8 (calc) (ref 1.0–2.5)
ALT: 29 U/L (ref 6–29)
AST: 24 U/L (ref 10–35)
Albumin: 4.8 g/dL (ref 3.6–5.1)
Alkaline phosphatase (APISO): 93 U/L (ref 37–153)
BUN: 11 mg/dL (ref 7–25)
CO2: 31 mmol/L (ref 20–32)
Calcium: 10.1 mg/dL (ref 8.6–10.4)
Chloride: 101 mmol/L (ref 98–110)
Creat: 0.88 mg/dL (ref 0.50–1.05)
Globulin: 2.6 g/dL (calc) (ref 1.9–3.7)
Glucose, Bld: 93 mg/dL (ref 65–99)
Potassium: 4.6 mmol/L (ref 3.5–5.3)
Sodium: 141 mmol/L (ref 135–146)
Total Bilirubin: 0.5 mg/dL (ref 0.2–1.2)
Total Protein: 7.4 g/dL (ref 6.1–8.1)

## 2020-03-09 LAB — LIPID PANEL
Cholesterol: 242 mg/dL — ABNORMAL HIGH (ref <200)
HDL: 61 mg/dL (ref >=50)
LDL Cholesterol (Calc): 147 mg/dL (calc) — ABNORMAL HIGH
Non-HDL Cholesterol (Calc): 181 mg/dL (calc) — ABNORMAL HIGH (ref <130)
Total CHOL/HDL Ratio: 4 (calc) (ref <5.0)
Triglycerides: 197 mg/dL — ABNORMAL HIGH (ref <150)

## 2020-03-09 LAB — VITAMIN D 25 HYDROXY (VIT D DEFICIENCY, FRACTURES): Vit D, 25-Hydroxy: 22 ng/mL — ABNORMAL LOW (ref 30–100)

## 2020-03-09 LAB — HEMOGLOBIN A1C
Hgb A1c MFr Bld: 6.4 % of total Hgb — ABNORMAL HIGH (ref <5.7)
Mean Plasma Glucose: 137 (calc)
eAG (mmol/L): 7.6 (calc)

## 2020-03-09 LAB — TSH: TSH: 1.78 mIU/L

## 2020-04-11 ENCOUNTER — Other Ambulatory Visit: Payer: Self-pay | Admitting: Internal Medicine

## 2020-06-15 ENCOUNTER — Telehealth: Payer: Self-pay | Admitting: Internal Medicine

## 2020-06-15 NOTE — Telephone Encounter (Signed)
Called and spoke with patient today. 

## 2020-06-15 NOTE — Telephone Encounter (Signed)
    Patient states she dropped off a proof of physical form approximately 3 weeks ago, is this complete? She is approaching the  deadline to have completed to obtain discount on her insurance premium.

## 2020-07-01 DIAGNOSIS — Z20822 Contact with and (suspected) exposure to covid-19: Secondary | ICD-10-CM | POA: Diagnosis not present

## 2020-07-01 DIAGNOSIS — Z03818 Encounter for observation for suspected exposure to other biological agents ruled out: Secondary | ICD-10-CM | POA: Diagnosis not present

## 2020-10-08 ENCOUNTER — Encounter: Payer: Self-pay | Admitting: Internal Medicine

## 2020-10-08 ENCOUNTER — Telehealth: Payer: Self-pay | Admitting: Internal Medicine

## 2020-10-08 NOTE — Telephone Encounter (Signed)
Patient requesting order for zolpidem (AMBIEN) 10 MG tablet.   Pharmacy [facility] [phone] - [place], [place] - [address]

## 2020-10-08 NOTE — Telephone Encounter (Signed)
Called and left message for patient today.  She will need to reach out to whoever has been prescribing her medication as Dr. Lawerance Bach hasn't prescribed this for her.

## 2020-10-08 NOTE — Telephone Encounter (Signed)
That is usually prescribed by someone else

## 2020-10-09 MED ORDER — ZOLPIDEM TARTRATE 10 MG PO TABS: 10.0000 mg | ORAL_TABLET | Freq: Every evening | ORAL | 2 refills | Status: DC | PRN

## 2021-01-04 ENCOUNTER — Other Ambulatory Visit: Payer: Self-pay | Admitting: Internal Medicine

## 2021-03-10 ENCOUNTER — Encounter: Payer: BC Managed Care – PPO | Admitting: Internal Medicine

## 2021-03-17 NOTE — Progress Notes (Addendum)
Subjective:    Patient ID: Victoria Stout, female    DOB: August 24, 1965, 55 y.o.   MRN: [id number]   This visit occurred during the SARS-CoV-2 public health emergency.  Safety protocols were in place, including screening questions prior to the visit, additional usage of staff PPE, and extensive cleaning of exam room while observing appropriate contact time as indicated for disinfecting solutions.    HPI She is here for a physical exam.   Work is stressful.  Mostly because they are short staffed.  She fell down the stairs and twisted her ankle.  She sees ortho next week.  Area on neck - has gotten bigger.  No pain.       Medications and allergies reviewed with patient and updated if appropriate.  Patient Active Problem List   Diagnosis Date Noted   Vitamin D deficiency 07/08/2019   Prediabetes 07/08/2019   Elevated TSH 07/08/2019   Hyperlipidemia 07/08/2019   Insomnia 07/08/2019   GERD (gastroesophageal reflux disease) 07/08/2019   Anxiety 07/08/2019    Current Outpatient Medications on File Prior to Visit  Medication Sig Dispense Refill   Cholecalciferol (VITAMIN D) 50 MCG (2000 UT) CAPS Take by mouth.     omeprazole (PRILOSEC) 20 MG capsule Take 20 mg by mouth daily.     venlafaxine XR (EFFEXOR-XR) 150 MG 24 hr capsule Take 1 capsule (150 mg total) by mouth daily with breakfast. 90 capsule 3   zolpidem (AMBIEN) 10 MG tablet TAKE 1 TABLET BY MOUTH AT BEDTIME AS NEEDED FOR SLEEP. 30 tablet 2   No current facility-administered medications on file prior to visit.    History reviewed. No pertinent past medical history.  Past Surgical History:  Procedure Laterality Date   BREAST BIOPSY Left    REDUCTION MAMMAPLASTY Bilateral     Social History   Socioeconomic History   Marital status: Married    Spouse name: Not on file   Number of children: Not on file   Years of education: Not on file   Highest education level: Not on file  Occupational History   Not on  file  Tobacco Use   Smoking status: Never   Smokeless tobacco: Never  Substance and Sexual Activity   Alcohol use: Not Currently   Drug use: Never   Sexual activity: Yes    Partners: Male  Other Topics Concern   Not on file  Social History Narrative   Not on file   Social Determinants of Health   Financial Resource Strain: Not on file  Food Insecurity: Not on file  Transportation Needs: Not on file  Physical Activity: Not on file  Stress: Not on file  Social Connections: Not on file    Family History  Problem Relation Age of Onset   Cancer Mother    Hypertension Mother    Hyperlipidemia Mother    Diabetes Father    Cancer Father    Hypertension Father    Diabetes Sister    Diabetes Brother    Breast cancer Neg Hx     Review of Systems  Constitutional:  Negative for chills and fever.  HENT:  Positive for trouble swallowing (at times from GERD).   Eyes:  Negative for visual disturbance.  Respiratory:  Negative for cough, shortness of breath and wheezing.   Cardiovascular:  Negative for chest pain, palpitations and leg swelling.  Gastrointestinal:  Negative for abdominal pain, blood in stool, constipation, diarrhea and nausea.       GERD  freq - foods, stress strenuous work - take omeprazole prn  Genitourinary:  Negative for dysuria.  Musculoskeletal:  Positive for back pain (with lifting). Negative for arthralgias.  Skin:  Negative for rash.  Neurological:  Negative for dizziness, light-headedness and headaches.  Psychiatric/Behavioral:  Positive for sleep disturbance. Negative for dysphoric mood. The patient is nervous/anxious (controlled).       Objective:   Vitals:   03/18/21 1034  BP: 140/76  Pulse: 83  Temp: 98.6 F (37 C)  SpO2: 96%   Filed Weights   03/18/21 1034  Weight: 229 lb (103.9 kg)   Body mass index is 39.31 kg/m.  BP Readings from Last 3 Encounters:  03/18/21 140/76  03/08/20 136/78  09/08/19 136/82    Wt Readings from Last 3  Encounters:  03/18/21 229 lb (103.9 kg)  03/08/20 220 lb (99.8 kg)  09/08/19 215 lb (97.5 kg)    Depression screen [facility] 2/9 03/18/2021 07/08/2019  Decreased Interest 0 0  Down, Depressed, Hopeless 1 0  PHQ - 2 Score 1 0  Altered sleeping 0 -  Tired, decreased energy 0 -  Change in appetite 0 -  Feeling bad or failure about yourself  0 -  Trouble concentrating 0 -  Moving slowly or fidgety/restless 0 -  Suicidal thoughts 0 -  PHQ-9 Score 1 -  Difficult doing work/chores Not difficult at all -    GAD 7 : Generalized Anxiety Score 03/18/2021  Nervous, Anxious, on Edge 1  Control/stop worrying 1  Worry too much - different things 1  Trouble relaxing 0  Restless 0  Easily annoyed or irritable 0  Afraid - awful might happen 0  Total GAD 7 Score 3  Anxiety Difficulty Not difficult at all       Physical Exam Constitutional: She appears well-developed and well-nourished. No distress.  HENT:  Head: Normocephalic and atraumatic.  Right Ear: External ear normal. Normal ear canal and TM Left Ear: External ear normal.  Normal ear canal and TM Mouth/Throat: Oropharynx is clear and moist.  Eyes: Conjunctivae and EOM are normal.  Neck: Neck supple. No tracheal deviation present. No thyromegaly present.  No carotid bruit  Cardiovascular: Normal rate, regular rhythm and normal heart sounds.   No murmur heard.  No edema. Pulmonary/Chest: Effort normal and breath sounds normal. No respiratory distress. She has no wheezes. She has no rales.  Breast: deferred   Abdominal: Soft. She exhibits no distension. There is no tenderness.  Lymphadenopathy: She has no cervical adenopathy.  Skin: Skin is warm and dry. She is not diaphoretic.  Psychiatric: She has a normal mood and affect. Her behavior is normal.     Lab Results  Component Value Date   WBC 6.4 03/08/2020   HGB 13.3 03/08/2020   HCT 42.1 03/08/2020   PLT 256 03/08/2020   GLUCOSE 93 03/08/2020   CHOL 242 (H) 03/08/2020   TRIG  197 (H) 03/08/2020   HDL 61 03/08/2020   LDLCALC 147 (H) 03/08/2020   ALT 29 03/08/2020   AST 24 03/08/2020   NA 141 03/08/2020   K 4.6 03/08/2020   CL 101 03/08/2020   CREATININE 0.88 03/08/2020   BUN 11 03/08/2020   CO2 31 03/08/2020   TSH 1.78 03/08/2020   HGBA1C 6.4 (H) 03/08/2020         Assessment & Plan:   Physical exam: Screening blood work  ordered Exercise  none Weight  encouraged weight loss Substance abuse  none   Screened  for depression using the PHQ 9 scale.  No evidence of depression.    Reviewed recommended immunizations.  Advise getting shingles vaccine  Will call [facility] to schedule colonoscopy   Health Maintenance  Topic Date Due   COLONOSCOPY (Pts 45-27yrs Insurance coverage will need to be confirmed)  Never done   Zoster Vaccines- Shingrix (1 of 2) Never done   PAP SMEAR-Modifier  05/15/2020   COVID-19 Vaccine (3 - Booster for Pfizer series) 12/04/2020   INFLUENZA VACCINE  02/21/2021   Hepatitis C Screening  03/08/2049 (Originally 04/14/1983)   HIV Screening  03/08/2049 (Originally 04/13/1980)   MAMMOGRAM  02/25/2022   TETANUS/TDAP  09/07/2029   Pneumococcal Vaccine 37-58 Years old  Aged Out   HPV VACCINES  Aged Out          See Problem List for Assessment and Plan of chronic medical problems.

## 2021-03-17 NOTE — Patient Instructions (Addendum)
Blood work was ordered.     Medications changes include :   none     Please followup in 6 months    Health Maintenance, Female Adopting a healthy lifestyle and getting preventive care are important in promoting health and wellness. Ask your health care provider about: The right schedule for you to have regular tests and exams. Things you can do on your own to prevent diseases and keep yourself healthy. What should I know about diet, weight, and exercise? Eat a healthy diet  Eat a diet that includes plenty of vegetables, fruits, low-fat dairy products, and lean protein. Do not eat a lot of foods that are high in solid fats, added sugars, or sodium.  Maintain a healthy weight Body mass index (BMI) is used to identify weight problems. It estimates body fat based on height and weight. Your health care provider can help determineyour BMI and help you achieve or maintain a healthy weight. Get regular exercise Get regular exercise. This is one of the most important things you can do for your health. Most adults should: Exercise for at least 150 minutes each week. The exercise should increase your heart rate and make you sweat (moderate-intensity exercise). Do strengthening exercises at least twice a week. This is in addition to the moderate-intensity exercise. Spend less time sitting. Even light physical activity can be beneficial. Watch cholesterol and blood lipids Have your blood tested for lipids and cholesterol at 56 years of age, then havethis test every 5 years. Have your cholesterol levels checked more often if: Your lipid or cholesterol levels are high. You are older than 56 years of age. You are at high risk for heart disease. What should I know about cancer screening? Depending on your health history and family history, you may need to have cancer screening at various ages. This may include screening for: Breast cancer. Cervical cancer. Colorectal cancer. Skin  cancer. Lung cancer. What should I know about heart disease, diabetes, and high blood pressure? Blood pressure and heart disease High blood pressure causes heart disease and increases the risk of stroke. This is more likely to develop in people who have high blood pressure readings, are of African descent, or are overweight. Have your blood pressure checked: Every 3-5 years if you are 18-39 years of age. Every year if you are 40 years old or older. Diabetes Have regular diabetes screenings. This checks your fasting blood sugar level. Have the screening done: Once every three years after age 40 if you are at a normal weight and have a low risk for diabetes. More often and at a younger age if you are overweight or have a high risk for diabetes. What should I know about preventing infection? Hepatitis B If you have a higher risk for hepatitis B, you should be screened for this virus. Talk with your health care provider to find out if you are at risk forhepatitis B infection. Hepatitis C Testing is recommended for: Everyone born from 1945 through 1965. Anyone with known risk factors for hepatitis C. Sexually transmitted infections (STIs) Get screened for STIs, including gonorrhea and chlamydia, if: You are sexually active and are younger than 56 years of age. You are older than 56 years of age and your health care provider tells you that you are at risk for this type of infection. Your sexual activity has changed since you were last screened, and you are at increased risk for chlamydia or gonorrhea. Ask your health care provider if you are   at risk. Ask your health care provider about whether you are at high risk for HIV. Your health care provider may recommend a prescription medicine to help prevent HIV infection. If you choose to take medicine to prevent HIV, you should first get tested for HIV. You should then be tested every 3 months for as long as you are taking the medicine. Pregnancy If  you are about to stop having your period (premenopausal) and you may become pregnant, seek counseling before you get pregnant. Take 400 to 800 micrograms (mcg) of folic acid every day if you become pregnant. Ask for birth control (contraception) if you want to prevent pregnancy. Osteoporosis and menopause Osteoporosis is a disease in which the bones lose minerals and strength with aging. This can result in bone fractures. If you are 65 years old or older, or if you are at risk for osteoporosis and fractures, ask your health care provider if you should: Be screened for bone loss. Take a calcium or vitamin D supplement to lower your risk of fractures. Be given hormone replacement therapy (HRT) to treat symptoms of menopause. Follow these instructions at home: Lifestyle Do not use any products that contain nicotine or tobacco, such as cigarettes, e-cigarettes, and chewing tobacco. If you need help quitting, ask your health care provider. Do not use street drugs. Do not share needles. Ask your health care provider for help if you need support or information about quitting drugs. Alcohol use Do not drink alcohol if: Your health care provider tells you not to drink. You are pregnant, may be pregnant, or are planning to become pregnant. If you drink alcohol: Limit how much you use to 0-1 drink a day. Limit intake if you are breastfeeding. Be aware of how much alcohol is in your drink. In the [place] one drink equals one 12 oz bottle of beer (355 mL), one 5 oz glass of wine (148 mL), or one 1 oz glass of hard liquor (44 mL). General instructions Schedule regular health, dental, and eye exams. Stay current with your vaccines. Tell your health care provider if: You often feel depressed. You have ever been abused or do not feel safe at home. Summary Adopting a healthy lifestyle and getting preventive care are important in promoting health and wellness. Follow your health care provider's  instructions about healthy diet, exercising, and getting tested or screened for diseases. Follow your health care provider's instructions on monitoring your cholesterol and blood pressure. This information is not intended to replace advice given to you by your health care provider. Make sure you discuss any questions you have with your healthcare provider. Document Revised: 07/03/2018 Document Reviewed: 07/03/2018 Elsevier Patient Education  2022 [employer]  

## 2021-03-18 ENCOUNTER — Encounter: Payer: Self-pay | Admitting: Internal Medicine

## 2021-03-18 ENCOUNTER — Other Ambulatory Visit: Payer: Self-pay

## 2021-03-18 ENCOUNTER — Ambulatory Visit (INDEPENDENT_AMBULATORY_CARE_PROVIDER_SITE_OTHER): Payer: No Typology Code available for payment source | Admitting: Internal Medicine

## 2021-03-18 VITALS — BP 140/76 | HR 83 | Temp 98.6°F | Ht 64.0 in | Wt 229.0 lb

## 2021-03-18 DIAGNOSIS — F419 Anxiety disorder, unspecified: Secondary | ICD-10-CM

## 2021-03-18 DIAGNOSIS — E782 Mixed hyperlipidemia: Secondary | ICD-10-CM | POA: Diagnosis not present

## 2021-03-18 DIAGNOSIS — F5101 Primary insomnia: Secondary | ICD-10-CM

## 2021-03-18 DIAGNOSIS — R7303 Prediabetes: Secondary | ICD-10-CM | POA: Diagnosis not present

## 2021-03-18 DIAGNOSIS — E559 Vitamin D deficiency, unspecified: Secondary | ICD-10-CM

## 2021-03-18 DIAGNOSIS — Z0000 Encounter for general adult medical examination without abnormal findings: Secondary | ICD-10-CM | POA: Diagnosis not present

## 2021-03-18 DIAGNOSIS — K219 Gastro-esophageal reflux disease without esophagitis: Secondary | ICD-10-CM | POA: Diagnosis not present

## 2021-03-18 DIAGNOSIS — R7989 Other specified abnormal findings of blood chemistry: Secondary | ICD-10-CM

## 2021-03-18 DIAGNOSIS — Z1331 Encounter for screening for depression: Secondary | ICD-10-CM

## 2021-03-18 LAB — CBC WITH DIFFERENTIAL/PLATELET
Basophils Absolute: 0.1 10*3/uL (ref 0.0–0.1)
Basophils Relative: 0.8 % (ref 0.0–3.0)
Eosinophils Absolute: 0.3 10*3/uL (ref 0.0–0.7)
Eosinophils Relative: 3.8 % (ref 0.0–5.0)
HCT: 39.5 % (ref 36.0–46.0)
Hemoglobin: 13.1 g/dL (ref 12.0–15.0)
Lymphocytes Relative: 39.4 % (ref 12.0–46.0)
Lymphs Abs: 2.9 10*3/uL (ref 0.7–4.0)
MCHC: 33 g/dL (ref 30.0–36.0)
MCV: 90 fl (ref 78.0–100.0)
Monocytes Absolute: 0.5 10*3/uL (ref 0.1–1.0)
Monocytes Relative: 7 % (ref 3.0–12.0)
Neutro Abs: 3.6 10*3/uL (ref 1.4–7.7)
Neutrophils Relative %: 49 % (ref 43.0–77.0)
Platelets: 266 10*3/uL (ref 150.0–400.0)
RBC: 4.39 Mil/uL (ref 3.87–5.11)
RDW: 14.1 % (ref 11.5–15.5)
WBC: 7.3 10*3/uL (ref 4.0–10.5)

## 2021-03-18 LAB — COMPREHENSIVE METABOLIC PANEL
ALT: 36 U/L — ABNORMAL HIGH (ref 0–35)
AST: 26 U/L (ref 0–37)
Albumin: 4.4 g/dL (ref 3.5–5.2)
Alkaline Phosphatase: 89 U/L (ref 39–117)
BUN: 12 mg/dL (ref 6–23)
CO2: 28 mEq/L (ref 19–32)
Calcium: 10 mg/dL (ref 8.4–10.5)
Chloride: 101 mEq/L (ref 96–112)
Creatinine, Ser: 0.93 mg/dL (ref 0.40–1.20)
GFR: 68.99 mL/min (ref >60.00)
Glucose, Bld: 99 mg/dL (ref 70–99)
Potassium: 4.6 mEq/L (ref 3.5–5.1)
Sodium: 139 mEq/L (ref 135–145)
Total Bilirubin: 0.4 mg/dL (ref 0.2–1.2)
Total Protein: 7.9 g/dL (ref 6.0–8.3)

## 2021-03-18 LAB — LIPID PANEL
Cholesterol: 239 mg/dL — ABNORMAL HIGH (ref 0–200)
HDL: 55.7 mg/dL (ref >39.00)
NonHDL: 183.56
Total CHOL/HDL Ratio: 4
Triglycerides: 209 mg/dL — ABNORMAL HIGH (ref 0.0–149.0)
VLDL: 41.8 mg/dL — ABNORMAL HIGH (ref 0.0–40.0)

## 2021-03-18 LAB — LDL CHOLESTEROL, DIRECT: Direct LDL: 139 mg/dL

## 2021-03-18 LAB — TSH: TSH: 8.32 u[IU]/mL — ABNORMAL HIGH (ref 0.35–5.50)

## 2021-03-18 LAB — HEMOGLOBIN A1C: Hgb A1c MFr Bld: 6.7 % — ABNORMAL HIGH (ref 4.6–6.5)

## 2021-03-18 LAB — VITAMIN D 25 HYDROXY (VIT D DEFICIENCY, FRACTURES): VITD: 19.79 ng/mL — ABNORMAL LOW (ref 30.00–100.00)

## 2021-03-18 NOTE — Assessment & Plan Note (Signed)
Chronic History of elevated TSH Check TSH

## 2021-03-18 NOTE — Assessment & Plan Note (Signed)
Chronic Overall controlled Continue Ambien 10 mg nightly as needed Encouraged increased exercise, which may help sleep

## 2021-03-18 NOTE — Assessment & Plan Note (Signed)
Chronic Check a1c    Her last A1c was very close to being in the diabetic range last year-stressed regular exercise, weight loss and a low sugar/carbohydrate diet

## 2021-03-18 NOTE — Assessment & Plan Note (Signed)
Chronic Experiencing frequent GERD Taking omeprazole 20 mg daily as needed-she is afraid to take it too regularly because she is aware of long-term side effects Stressed the concerns with uncontrolled GERD and risk for esophageal cancer and the need to get GERD controlled Stressed GERD diet, weight loss, not laying down too soon after eating, stress reduction Pepcid has not worked for her in the past Advised she may need to take the omeprazole more often and that this is safer than having her Gherghe uncontrolled

## 2021-03-18 NOTE — Assessment & Plan Note (Signed)
Chronic Has been diet controlled Check lipid panel Encouraged regular exercise, weight loss and healthy diet

## 2021-03-18 NOTE — Assessment & Plan Note (Signed)
Chronic Taking vitamin D daily Check level 

## 2021-03-18 NOTE — Assessment & Plan Note (Addendum)
Chronic GAD7 score 3 Fairly controlled-work is stressful so there is still some anxiety, but she thinks her medication is helping Continue venlafaxine 150 mg daily Encourage regular exercise, which will also help

## 2021-03-28 ENCOUNTER — Other Ambulatory Visit: Payer: Self-pay | Admitting: Internal Medicine

## 2021-04-04 ENCOUNTER — Other Ambulatory Visit: Payer: Self-pay | Admitting: Internal Medicine

## 2021-06-30 ENCOUNTER — Other Ambulatory Visit: Payer: Self-pay | Admitting: Internal Medicine

## 2021-07-23 IMAGING — MG DIGITAL SCREENING BILAT W/ TOMO W/ CAD
8 series · 8 of 24 positions shown · non-contrast
Comparison: Previous exam(s).

CLINICAL DATA: Screening.

EXAM:
DIGITAL SCREENING BILATERAL MAMMOGRAM WITH TOMO AND CAD

[L CC synth-2D]
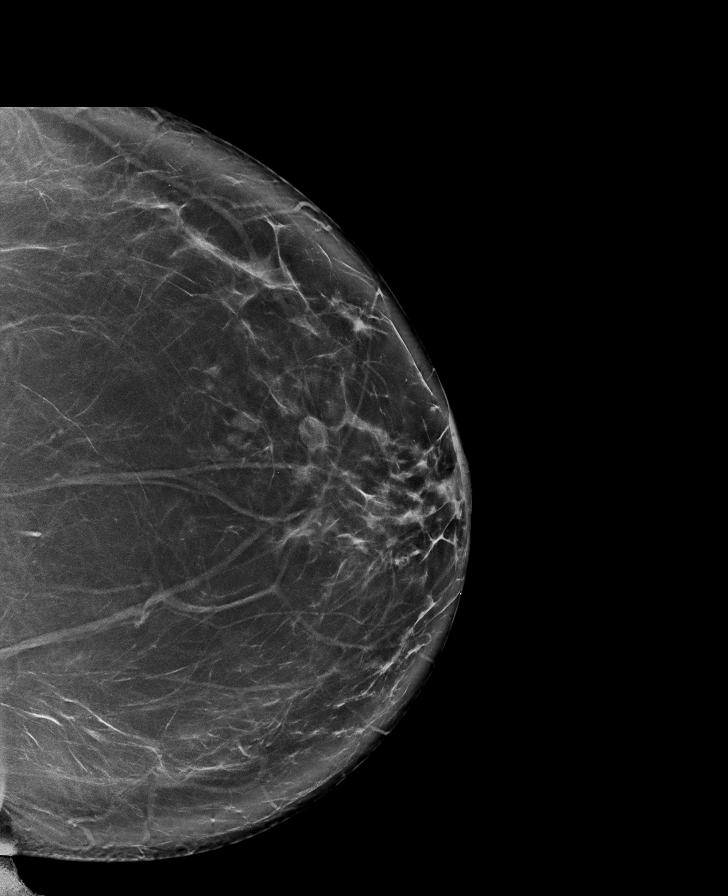

[L MLO synth-2D]
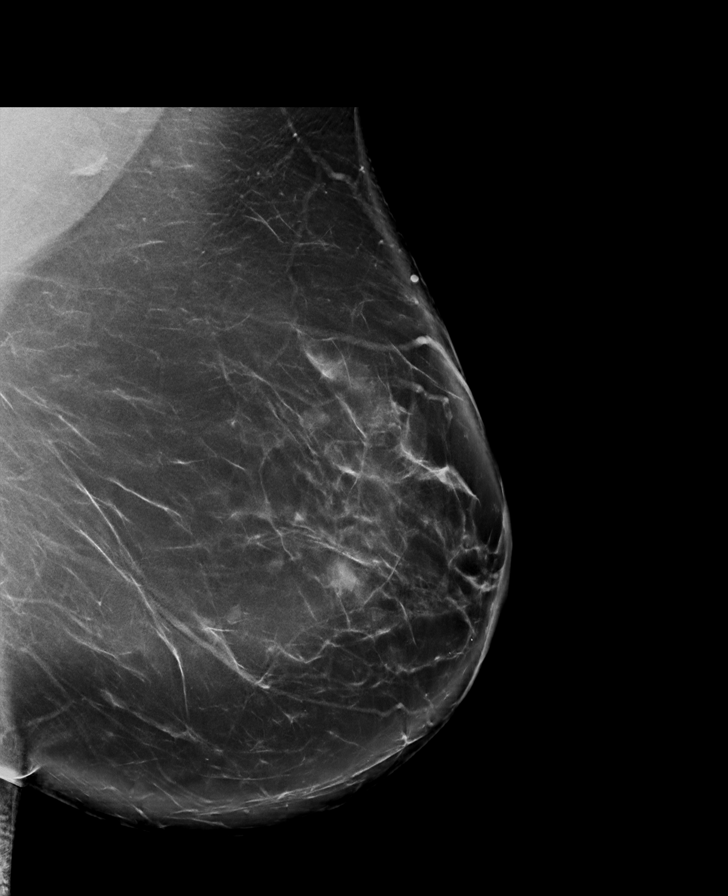

[R CC synth-2D]
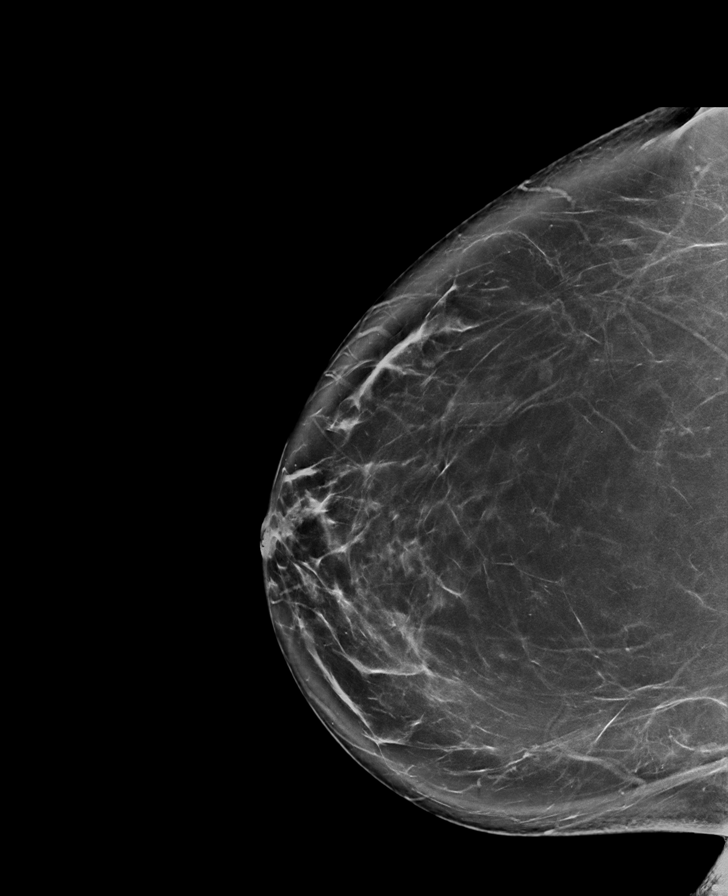

[R MLO synth-2D]
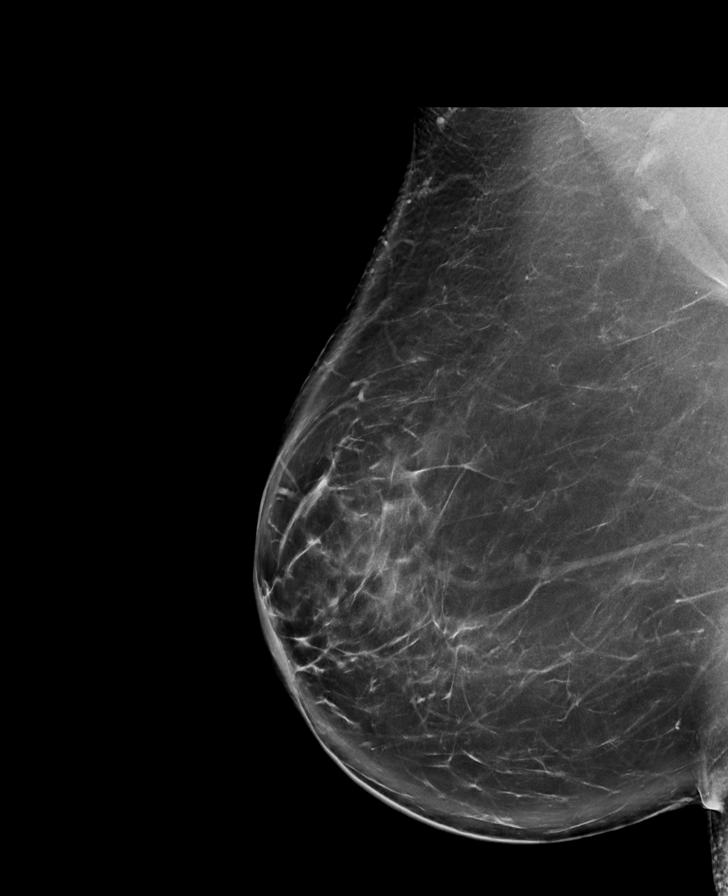

[R MLO tomo · tomo slice 57/114.0]
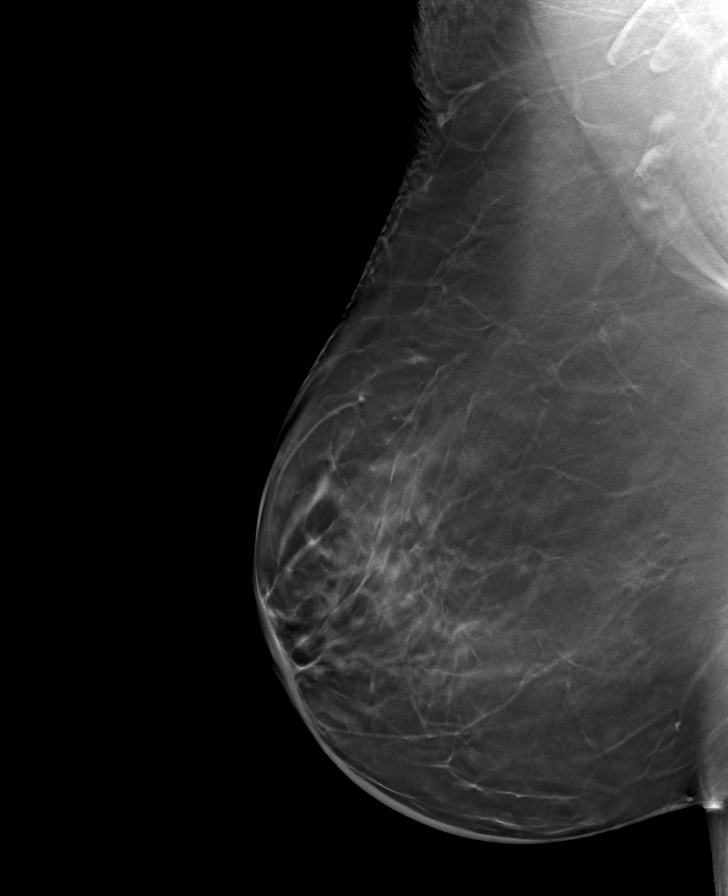

[L MLO tomo · tomo slice 58/115.0]
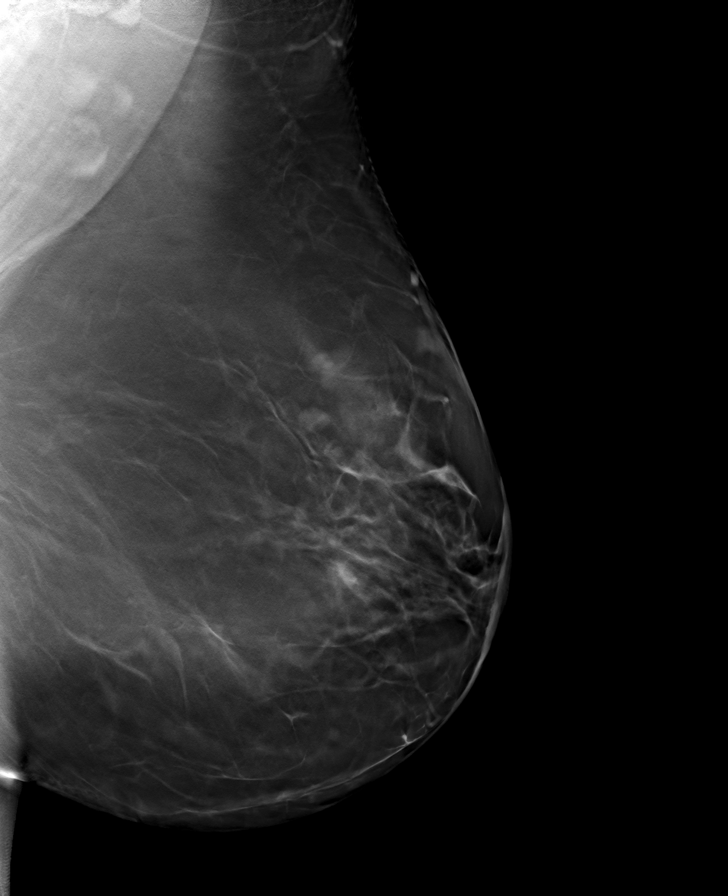

[R CC tomo · tomo slice 49/98.0]
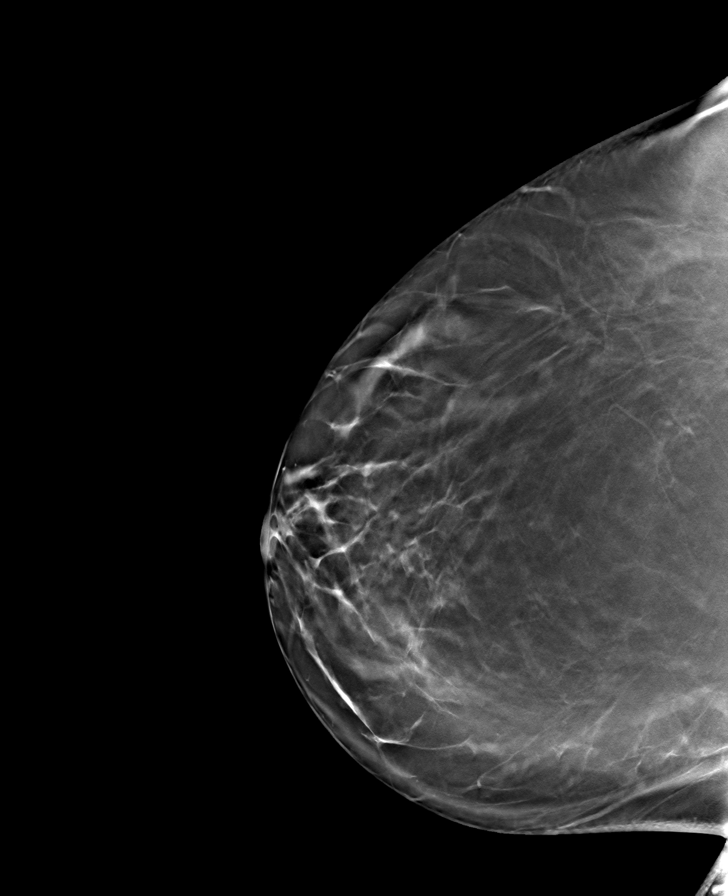

[L CC tomo · tomo slice 49/98.0]
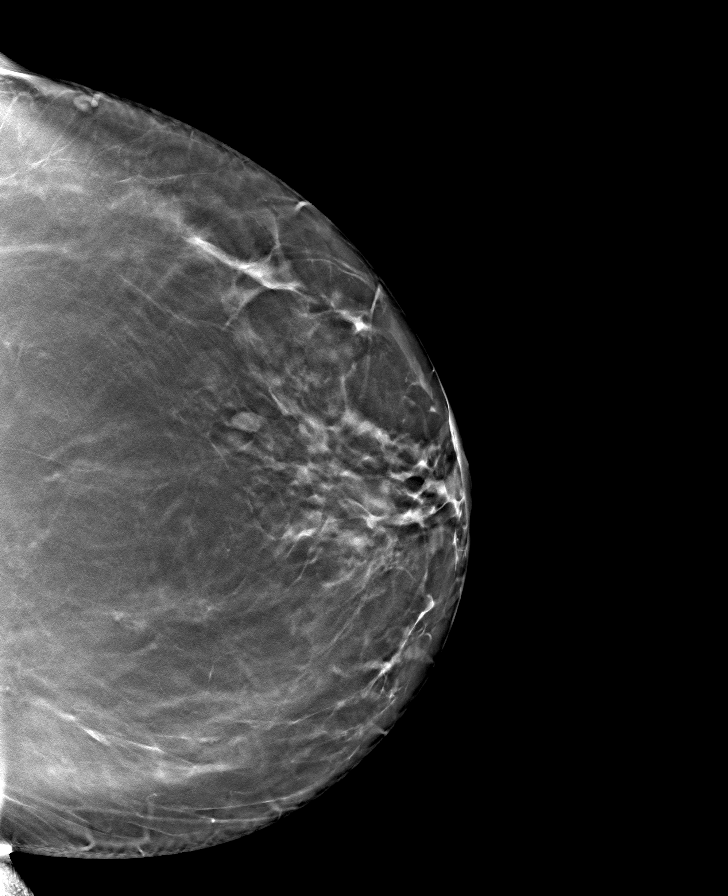

[8 of 24 positions shown; findings below may reference images not displayed]

ACR Breast Density Category b: There are scattered areas of
fibroglandular density.
FINDINGS: There are no findings suspicious for malignancy. Images were
processed with CAD.
IMPRESSION: No mammographic evidence of malignancy. A result letter of this
screening mammogram will be mailed directly to the patient.

RECOMMENDATION:
Screening mammogram in one year. (Code:[id number])

BI-RADS CATEGORY  1: Negative.

## 2021-08-29 ENCOUNTER — Other Ambulatory Visit: Payer: Self-pay | Admitting: Internal Medicine

## 2021-09-23 ENCOUNTER — Ambulatory Visit: Payer: No Typology Code available for payment source | Admitting: Internal Medicine

## 2021-09-26 NOTE — Patient Instructions (Addendum)
? ? ? ?  Shingrix vaccine given today ? ? ?Blood work was ordered.   ? ? ?Medications changes include :   none ? ? ? ? ? ?Return in about 6 months (around 03/30/2022) for CPE. ? ?

## 2021-09-26 NOTE — Progress Notes (Signed)
? ? ? ? ?Subjective:  ? ? Patient ID: Victoria Stout, female    DOB: 16-Mar-1966, 56 y.o.   MRN: [id number] ? ?This visit occurred during the SARS-CoV-2 public health emergency.  Safety protocols were in place, including screening questions prior to the visit, additional usage of staff PPE, and extensive cleaning of exam room while observing appropriate contact time as indicated for disinfecting solutions.   ? ? ?HPI ?Victoria Stout is here for follow up of her chronic medical problems, including GERD, anxiety, insomnia, prediabetes, vit d def ? ?Colonoscopy scheduled.  [facility] ? ?She is starting to walk.   ? ?No concerns. ? ? ?Medications and allergies reviewed with patient and updated if appropriate. ? ?Current Outpatient Medications on File Prior to Visit  ?Medication Sig Dispense Refill  ? Cholecalciferol (VITAMIN D) 50 MCG (2000 UT) CAPS Take by mouth.    ? ciclopirox (PENLAC) 8 % solution Apply topically at bedtime.    ? nystatin ointment (MYCOSTATIN) Apply topically at bedtime.    ? omeprazole (PRILOSEC) 20 MG capsule Take 20 mg by mouth daily.    ? venlafaxine XR (EFFEXOR-XR) 150 MG 24 hr capsule TAKE 1 CAPSULE BY MOUTH DAILY WITH BREAKFAST. 90 capsule 3  ? zolpidem (AMBIEN) 10 MG tablet TAKE 1 TABLET BY MOUTH EVERY DAY AT BEDTIME AS NEEDED FOR SLEEP 30 tablet 1  ? ?No current facility-administered medications on file prior to visit.  ? ? ? ?Review of Systems  ?Constitutional:  Negative for chills and fever.  ?Respiratory:  Negative for cough, shortness of breath and wheezing.   ?Cardiovascular:  Negative for chest pain, palpitations and leg swelling.  ?Neurological:  Positive for dizziness (a spell a couple of weeks ago - lasted one day). Negative for light-headedness and headaches.  ? ?   ?Objective:  ? ?Vitals:  ? 09/27/21 1257  ?BP: (!) 142/80  ?Pulse: 80  ?Temp: 98.4 ?F (36.9 ?C)  ?SpO2: 98%  ? ?BP Readings from Last 3 Encounters:  ?09/27/21 (!) 142/80  ?03/18/21 140/76  ?03/08/20 136/78  ? ?Wt Readings from  Last 3 Encounters:  ?09/27/21 225 lb (102.1 kg)  ?03/18/21 229 lb (103.9 kg)  ?03/08/20 220 lb (99.8 kg)  ? ?Body mass index is 38.62 kg/m?. ? ?  ?Physical Exam ?Constitutional:   ?   General: She is not in acute distress. ?   Appearance: Normal appearance.  ?HENT:  ?   Head: Normocephalic and atraumatic.  ?Eyes:  ?   Conjunctiva/sclera: Conjunctivae normal.  ?Cardiovascular:  ?   Rate and Rhythm: Normal rate and regular rhythm.  ?   Heart sounds: Normal heart sounds. No murmur heard. ?Pulmonary:  ?   Effort: Pulmonary effort is normal. No respiratory distress.  ?   Breath sounds: Normal breath sounds. No wheezing.  ?Musculoskeletal:  ?   Cervical back: Neck supple.  ?   Right lower leg: No edema.  ?   Left lower leg: No edema.  ?Lymphadenopathy:  ?   Cervical: No cervical adenopathy.  ?Skin: ?   Findings: No rash.  ?Neurological:  ?   Mental Status: She is alert. Mental status is at baseline.  ?Psychiatric:     ?   Mood and Affect: Mood normal.     ?   Behavior: Behavior normal.  ? ?   ? ?Lab Results  ?Component Value Date  ? WBC 7.3 03/18/2021  ? HGB 13.1 03/18/2021  ? HCT 39.5 03/18/2021  ? PLT 266.0 03/18/2021  ?  GLUCOSE 99 03/18/2021  ? CHOL 239 (H) 03/18/2021  ? TRIG 209.0 (H) 03/18/2021  ? HDL 55.70 03/18/2021  ? LDLDIRECT 139.0 03/18/2021  ? Cade 147 (H) 03/08/2020  ? ALT 36 (H) 03/18/2021  ? AST 26 03/18/2021  ? NA 139 03/18/2021  ? K 4.6 03/18/2021  ? CL 101 03/18/2021  ? CREATININE 0.93 03/18/2021  ? BUN 12 03/18/2021  ? CO2 28 03/18/2021  ? TSH 8.32 (H) 03/18/2021  ? HGBA1C 6.7 (H) 03/18/2021  ? ? ? ?Assessment & Plan:  ? ? ?Shingrix #1 given ? ?See Problem List for Assessment and Plan of chronic medical problems.  ? ? ?

## 2021-09-27 ENCOUNTER — Ambulatory Visit: Payer: No Typology Code available for payment source | Admitting: Internal Medicine

## 2021-09-27 ENCOUNTER — Other Ambulatory Visit: Payer: Self-pay

## 2021-09-27 ENCOUNTER — Encounter: Payer: Self-pay | Admitting: Internal Medicine

## 2021-09-27 ENCOUNTER — Other Ambulatory Visit: Payer: Self-pay | Admitting: Internal Medicine

## 2021-09-27 VITALS — BP 142/80 | HR 80 | Temp 98.4°F | Ht 64.0 in | Wt 225.0 lb

## 2021-09-27 DIAGNOSIS — R7303 Prediabetes: Secondary | ICD-10-CM | POA: Diagnosis not present

## 2021-09-27 DIAGNOSIS — F5101 Primary insomnia: Secondary | ICD-10-CM

## 2021-09-27 DIAGNOSIS — R7989 Other specified abnormal findings of blood chemistry: Secondary | ICD-10-CM

## 2021-09-27 DIAGNOSIS — K219 Gastro-esophageal reflux disease without esophagitis: Secondary | ICD-10-CM | POA: Diagnosis not present

## 2021-09-27 DIAGNOSIS — Z1231 Encounter for screening mammogram for malignant neoplasm of breast: Secondary | ICD-10-CM

## 2021-09-27 DIAGNOSIS — F419 Anxiety disorder, unspecified: Secondary | ICD-10-CM

## 2021-09-27 DIAGNOSIS — Z23 Encounter for immunization: Secondary | ICD-10-CM | POA: Diagnosis not present

## 2021-09-27 DIAGNOSIS — E559 Vitamin D deficiency, unspecified: Secondary | ICD-10-CM | POA: Diagnosis not present

## 2021-09-27 LAB — VITAMIN D 25 HYDROXY (VIT D DEFICIENCY, FRACTURES): VITD: 24.12 ng/mL — ABNORMAL LOW (ref 30.00–100.00)

## 2021-09-27 LAB — COMPREHENSIVE METABOLIC PANEL
ALT: 41 U/L — ABNORMAL HIGH (ref 0–35)
AST: 33 U/L (ref 0–37)
Albumin: 4.8 g/dL (ref 3.5–5.2)
Alkaline Phosphatase: 86 U/L (ref 39–117)
BUN: 13 mg/dL (ref 6–23)
CO2: 29 mEq/L (ref 19–32)
Calcium: 10 mg/dL (ref 8.4–10.5)
Chloride: 100 mEq/L (ref 96–112)
Creatinine, Ser: 0.89 mg/dL (ref 0.40–1.20)
GFR: 72.46 mL/min (ref >60.00)
Glucose, Bld: 95 mg/dL (ref 70–99)
Potassium: 4.1 mEq/L (ref 3.5–5.1)
Sodium: 139 mEq/L (ref 135–145)
Total Bilirubin: 0.3 mg/dL (ref 0.2–1.2)
Total Protein: 7.8 g/dL (ref 6.0–8.3)

## 2021-09-27 LAB — TSH: TSH: 1.82 u[IU]/mL (ref 0.35–5.50)

## 2021-09-27 LAB — HEMOGLOBIN A1C: Hgb A1c MFr Bld: 6.9 % — ABNORMAL HIGH (ref 4.6–6.5)

## 2021-09-27 NOTE — Assessment & Plan Note (Signed)
Chronic ?Controlled, stable ?Continue venlafaxine 150 mg daily ?

## 2021-09-27 NOTE — Assessment & Plan Note (Addendum)
Chronic GERD controlled Continue omeprazole 20 mg daily  

## 2021-09-27 NOTE — Assessment & Plan Note (Signed)
Chronic Check a1c Low sugar / carb diet Stressed regular exercise  

## 2021-09-27 NOTE — Assessment & Plan Note (Addendum)
Chronic ?Taking vitamin D daily inconsistent ?Check vitamin D level  ?

## 2021-09-27 NOTE — Assessment & Plan Note (Signed)
History of elevated TSH Check TSH 

## 2021-09-27 NOTE — Assessment & Plan Note (Signed)
Chronic ?Controlled, Stable ?Continue Ambien 5 mg nightly as needed-typically takes a few nights a week ?

## 2021-09-27 NOTE — Addendum Note (Signed)
Addended by: Marcina Millard on: 09/27/2021 01:31 PM ? ? Modules accepted: Orders ? ?

## 2021-10-11 ENCOUNTER — Ambulatory Visit
Admission: RE | Admit: 2021-10-11 | Discharge: 2021-10-11 | Disposition: A | Discharge status: Home / Self Care | Payer: No Typology Code available for payment source | Source: Ambulatory Visit | Attending: Internal Medicine | Admitting: Internal Medicine

## 2021-10-11 DIAGNOSIS — Z1231 Encounter for screening mammogram for malignant neoplasm of breast: Secondary | ICD-10-CM

## 2021-10-26 ENCOUNTER — Other Ambulatory Visit: Payer: Self-pay | Admitting: Internal Medicine

## 2021-11-09 ENCOUNTER — Encounter: Payer: Self-pay | Admitting: Internal Medicine

## 2021-12-25 ENCOUNTER — Other Ambulatory Visit: Payer: Self-pay | Admitting: Internal Medicine

## 2022-03-23 ENCOUNTER — Other Ambulatory Visit: Payer: Self-pay | Admitting: Internal Medicine

## 2022-04-02 NOTE — Progress Notes (Unsigned)
Subjective:    Patient ID: Victoria Stout, female    DOB: 04-03-1966, 56 y.o.   MRN: [id number]      HPI Villa is here for a Physical exam.   She denies any changes in her health and has no concerns.   Medications and allergies reviewed with patient and updated if appropriate.  Current Outpatient Medications on File Prior to Visit  Medication Sig Dispense Refill   ciclopirox (PENLAC) 8 % solution Apply topically at bedtime.     nystatin ointment (MYCOSTATIN) Apply topically at bedtime.     omeprazole (PRILOSEC) 20 MG capsule Take 20 mg by mouth daily.     venlafaxine XR (EFFEXOR-XR) 150 MG 24 hr capsule TAKE 1 CAPSULE BY MOUTH DAILY WITH BREAKFAST. 90 capsule 3   zolpidem (AMBIEN) 10 MG tablet TAKE 1 TABLET BY MOUTH EVERY DAY AT BEDTIME AS NEEDED FOR SLEEP 30 tablet 3   No current facility-administered medications on file prior to visit.    Review of Systems  Constitutional:  Negative for fever.  Eyes:  Negative for visual disturbance.  Respiratory:  Negative for cough, shortness of breath and wheezing.   Cardiovascular:  Negative for chest pain, palpitations and leg swelling.  Gastrointestinal:  Negative for abdominal pain, blood in stool, constipation, diarrhea and nausea.       GERD  Genitourinary:  Negative for dysuria.  Musculoskeletal:  Positive for back pain (lower back at times - lifting, certain activities). Negative for arthralgias.  Skin:  Negative for rash.       Hair thinning  Neurological:  Negative for light-headedness and headaches.  Psychiatric/Behavioral:  Negative for dysphoric mood. The patient is nervous/anxious (controlled).        Objective:   Vitals:   04/04/22 1301  BP: 126/68  Pulse: 70  Temp: 98 F (36.7 C)  SpO2: 96%   Filed Weights   04/04/22 1301  Weight: 222 lb 6.4 oz (100.9 kg)   Body mass index is 38.17 kg/m.  BP Readings from Last 3 Encounters:  04/04/22 126/68  09/27/21 (!) 142/80  03/18/21 140/76    Wt  Readings from Last 3 Encounters:  04/04/22 222 lb 6.4 oz (100.9 kg)  09/27/21 225 lb (102.1 kg)  03/18/21 229 lb (103.9 kg)       Physical Exam Constitutional: She appears well-developed and well-nourished. No distress.  HENT:  Head: Normocephalic and atraumatic.  Right Ear: External ear normal. Normal ear canal and TM Left Ear: External ear normal.  Normal ear canal and TM Mouth/Throat: Oropharynx is clear and moist.  Eyes: Conjunctivae normal.  Neck: Neck supple. No tracheal deviation present. No thyromegaly present.  No carotid bruit  Cardiovascular: Normal rate, regular rhythm and normal heart sounds.   No murmur heard.  No edema. Pulmonary/Chest: Effort normal and breath sounds normal. No respiratory distress. She has no wheezes. She has no rales.  Breast: deferred   Abdominal: Soft. She exhibits no distension. There is no tenderness.  Lymphadenopathy: She has no cervical adenopathy.  Skin: Skin is warm and dry. She is not diaphoretic.  Psychiatric: She has a normal mood and affect. Her behavior is normal.     Lab Results  Component Value Date   WBC 7.3 03/18/2021   HGB 13.1 03/18/2021   HCT 39.5 03/18/2021   PLT 266.0 03/18/2021   GLUCOSE 95 09/27/2021   CHOL 239 (H) 03/18/2021   TRIG 209.0 (H) 03/18/2021   HDL 55.70 03/18/2021   LDLDIRECT 139.0 03/18/2021  LDLCALC 147 (H) 03/08/2020   ALT 41 (H) 09/27/2021   AST 33 09/27/2021   NA 139 09/27/2021   K 4.1 09/27/2021   CL 100 09/27/2021   CREATININE 0.89 09/27/2021   BUN 13 09/27/2021   CO2 29 09/27/2021   TSH 1.82 09/27/2021   HGBA1C 6.9 (H) 09/27/2021         Assessment & Plan:   Physical exam: Screening blood work  ordered Exercise  getting back into walking at work Weight  obese - stressed weight loss Substance abuse  none   Reviewed recommended immunizations.  Shingles #2 today   Health Maintenance  Topic Date Due   COLONOSCOPY (Pts 45-58yrs Insurance coverage will need to be confirmed)   Never done   Zoster Vaccines- Shingrix (2 of 2) 11/22/2021   PAP SMEAR-Modifier  05/15/2022   COVID-19 Vaccine (3 - Pfizer series) 04/20/2022 (Originally 08/31/2020)   INFLUENZA VACCINE  10/22/2022 (Originally 02/21/2022)   Hepatitis C Screening  03/08/2049 (Originally 04/14/1983)   HIV Screening  03/08/2049 (Originally 04/13/1980)   MAMMOGRAM  10/12/2023   TETANUS/TDAP  09/07/2029   HPV VACCINES  Aged Out          See Problem List for Assessment and Plan of chronic medical problems.

## 2022-04-02 NOTE — Patient Instructions (Addendum)
Shingles vaccine today   Blood work was ordered.     Medications changes include :   none    Return in about 6 months (around 10/03/2022) for follow up.   Health Maintenance, Female Adopting a healthy lifestyle and getting preventive care are important in promoting health and wellness. Ask your health care provider about: The right schedule for you to have regular tests and exams. Things you can do on your own to prevent diseases and keep yourself healthy. What should I know about diet, weight, and exercise? Eat a healthy diet  Eat a diet that includes plenty of vegetables, fruits, low-fat dairy products, and lean protein. Do not eat a lot of foods that are high in solid fats, added sugars, or sodium. Maintain a healthy weight Body mass index (BMI) is used to identify weight problems. It estimates body fat based on height and weight. Your health care provider can help determine your BMI and help you achieve or maintain a healthy weight. Get regular exercise Get regular exercise. This is one of the most important things you can do for your health. Most adults should: Exercise for at least 150 minutes each week. The exercise should increase your heart rate and make you sweat (moderate-intensity exercise). Do strengthening exercises at least twice a week. This is in addition to the moderate-intensity exercise. Spend less time sitting. Even light physical activity can be beneficial. Watch cholesterol and blood lipids Have your blood tested for lipids and cholesterol at 57 years of age, then have this test every 5 years. Have your cholesterol levels checked more often if: Your lipid or cholesterol levels are high. You are older than 57 years of age. You are at high risk for heart disease. What should I know about cancer screening? Depending on your health history and family history, you may need to have cancer screening at various ages. This may include screening for: Breast  cancer. Cervical cancer. Colorectal cancer. Skin cancer. Lung cancer. What should I know about heart disease, diabetes, and high blood pressure? Blood pressure and heart disease High blood pressure causes heart disease and increases the risk of stroke. This is more likely to develop in people who have high blood pressure readings or are overweight. Have your blood pressure checked: Every 3-5 years if you are 46-61 years of age. Every year if you are 32 years old or older. Diabetes Have regular diabetes screenings. This checks your fasting blood sugar level. Have the screening done: Once every three years after age 11 if you are at a normal weight and have a low risk for diabetes. More often and at a younger age if you are overweight or have a high risk for diabetes. What should I know about preventing infection? Hepatitis B If you have a higher risk for hepatitis B, you should be screened for this virus. Talk with your health care provider to find out if you are at risk for hepatitis B infection. Hepatitis C Testing is recommended for: Everyone born from [birth year] through 1965. Anyone with known risk factors for hepatitis C. Sexually transmitted infections (STIs) Get screened for STIs, including gonorrhea and chlamydia, if: You are sexually active and are younger than 57 years of age. You are older than 57 years of age and your health care provider tells you that you are at risk for this type of infection. Your sexual activity has changed since you were last screened, and you are at increased risk for chlamydia or gonorrhea.  Ask your health care provider if you are at risk. Ask your health care provider about whether you are at high risk for HIV. Your health care provider may recommend a prescription medicine to help prevent HIV infection. If you choose to take medicine to prevent HIV, you should first get tested for HIV. You should then be tested every 3 months for as long as you are taking  the medicine. Pregnancy If you are about to stop having your period (premenopausal) and you may become pregnant, seek counseling before you get pregnant. Take 400 to 800 micrograms (mcg) of folic acid every day if you become pregnant. Ask for birth control (contraception) if you want to prevent pregnancy. Osteoporosis and menopause Osteoporosis is a disease in which the bones lose minerals and strength with aging. This can result in bone fractures. If you are 54 years old or older, or if you are at risk for osteoporosis and fractures, ask your health care provider if you should: Be screened for bone loss. Take a calcium or vitamin D supplement to lower your risk of fractures. Be given hormone replacement therapy (HRT) to treat symptoms of menopause. Follow these instructions at home: Alcohol use Do not drink alcohol if: Your health care provider tells you not to drink. You are pregnant, may be pregnant, or are planning to become pregnant. If you drink alcohol: Limit how much you have to: 0-1 drink a day. Know how much alcohol is in your drink. In the [place] one drink equals one 12 oz bottle of beer (355 mL), one 5 oz glass of wine (148 mL), or one 1 oz glass of hard liquor (44 mL). Lifestyle Do not use any products that contain nicotine or tobacco. These products include cigarettes, chewing tobacco, and vaping devices, such as e-cigarettes. If you need help quitting, ask your health care provider. Do not use street drugs. Do not share needles. Ask your health care provider for help if you need support or information about quitting drugs. General instructions Schedule regular health, dental, and eye exams. Stay current with your vaccines. Tell your health care provider if: You often feel depressed. You have ever been abused or do not feel safe at home. Summary Adopting a healthy lifestyle and getting preventive care are important in promoting health and wellness. Follow your health  care provider's instructions about healthy diet, exercising, and getting tested or screened for diseases. Follow your health care provider's instructions on monitoring your cholesterol and blood pressure. This information is not intended to replace advice given to you by your health care provider. Make sure you discuss any questions you have with your health care provider. Document Revised: 11/29/2020 Document Reviewed: 11/29/2020 Elsevier Patient Education  2023 [employer].

## 2022-04-03 ENCOUNTER — Encounter: Payer: Self-pay | Admitting: Internal Medicine

## 2022-04-04 ENCOUNTER — Ambulatory Visit (INDEPENDENT_AMBULATORY_CARE_PROVIDER_SITE_OTHER): Payer: No Typology Code available for payment source | Admitting: Internal Medicine

## 2022-04-04 VITALS — BP 126/68 | HR 70 | Temp 98.0°F | Ht 64.0 in | Wt 222.4 lb

## 2022-04-04 DIAGNOSIS — K219 Gastro-esophageal reflux disease without esophagitis: Secondary | ICD-10-CM

## 2022-04-04 DIAGNOSIS — Z23 Encounter for immunization: Secondary | ICD-10-CM | POA: Diagnosis not present

## 2022-04-04 DIAGNOSIS — Z0000 Encounter for general adult medical examination without abnormal findings: Secondary | ICD-10-CM

## 2022-04-04 DIAGNOSIS — Z6838 Body mass index (BMI) 38.0-38.9, adult: Secondary | ICD-10-CM

## 2022-04-04 DIAGNOSIS — F5101 Primary insomnia: Secondary | ICD-10-CM

## 2022-04-04 DIAGNOSIS — R7989 Other specified abnormal findings of blood chemistry: Secondary | ICD-10-CM

## 2022-04-04 DIAGNOSIS — E669 Obesity, unspecified: Secondary | ICD-10-CM | POA: Insufficient documentation

## 2022-04-04 DIAGNOSIS — R7303 Prediabetes: Secondary | ICD-10-CM

## 2022-04-04 DIAGNOSIS — E782 Mixed hyperlipidemia: Secondary | ICD-10-CM

## 2022-04-04 DIAGNOSIS — L659 Nonscarring hair loss, unspecified: Secondary | ICD-10-CM

## 2022-04-04 DIAGNOSIS — F419 Anxiety disorder, unspecified: Secondary | ICD-10-CM

## 2022-04-04 DIAGNOSIS — E559 Vitamin D deficiency, unspecified: Secondary | ICD-10-CM

## 2022-04-04 DIAGNOSIS — E119 Type 2 diabetes mellitus without complications: Secondary | ICD-10-CM

## 2022-04-04 NOTE — Assessment & Plan Note (Signed)
Chronic Controlled, Stable Check TSH Continue venlafaxine 150 mg daily

## 2022-04-04 NOTE — Assessment & Plan Note (Signed)
Chronic Controlled, Stable Continue Ambien 5 mg nightly as needed-does not take nightly, but takes a few nights a week 

## 2022-04-04 NOTE — Assessment & Plan Note (Signed)
Chronic GERD controlled Continue omeprazole 20 mg daily  

## 2022-04-04 NOTE — Assessment & Plan Note (Signed)
Chronic Regular exercise and healthy diet encouraged Check lipid panel  Currently diet controlled/lifestyle control 

## 2022-04-04 NOTE — Assessment & Plan Note (Signed)
Discussed importance of weight loss Stressed regular exercise - goal 30 minutes 5 days a week Discussed decreasing portions, decreasing sugars/carbs Increase veges, lean protin Consider nutrition referral   

## 2022-04-04 NOTE — Assessment & Plan Note (Addendum)
Chronic Reviewed blood work from her last visit which does confirm that she is a diabetic Check A1c, urine microalbumin today Discussed possible nutrition/diabetic educator referral-she will think about this Discussed goal of keeping A1c less than 7 which can be done with lifestyle changes and/or medication-stressed decreasing portions, exercising regularly-goal being 30 minutes 5 days a week, eating less carbs sugars and red meat and eating more fruits, vegetables and beans She has a strong family history of diabetes.  Discussed side effects of uncontrolled sugars We will see what her blood work is today and then consider medication and if we need to move up follow-up

## 2022-04-04 NOTE — Assessment & Plan Note (Signed)
New Check B12 level Start MVI

## 2022-04-04 NOTE — Assessment & Plan Note (Signed)
History of elevated TSH Check TSH 

## 2022-04-04 NOTE — Assessment & Plan Note (Signed)
Chronic Taking vitamin D daily Check vitamin D level  

## 2022-04-05 ENCOUNTER — Other Ambulatory Visit (INDEPENDENT_AMBULATORY_CARE_PROVIDER_SITE_OTHER): Payer: No Typology Code available for payment source

## 2022-04-05 DIAGNOSIS — L659 Nonscarring hair loss, unspecified: Secondary | ICD-10-CM | POA: Diagnosis not present

## 2022-04-05 DIAGNOSIS — K219 Gastro-esophageal reflux disease without esophagitis: Secondary | ICD-10-CM | POA: Diagnosis not present

## 2022-04-05 DIAGNOSIS — E559 Vitamin D deficiency, unspecified: Secondary | ICD-10-CM | POA: Diagnosis not present

## 2022-04-05 DIAGNOSIS — R7989 Other specified abnormal findings of blood chemistry: Secondary | ICD-10-CM

## 2022-04-05 DIAGNOSIS — R7303 Prediabetes: Secondary | ICD-10-CM | POA: Diagnosis not present

## 2022-04-05 DIAGNOSIS — E782 Mixed hyperlipidemia: Secondary | ICD-10-CM

## 2022-04-05 DIAGNOSIS — E119 Type 2 diabetes mellitus without complications: Secondary | ICD-10-CM

## 2022-04-05 LAB — CBC WITH DIFFERENTIAL/PLATELET
Basophils Absolute: 0.1 10*3/uL (ref 0.0–0.1)
Basophils Relative: 0.6 % (ref 0.0–3.0)
Eosinophils Absolute: 0.2 10*3/uL (ref 0.0–0.7)
Eosinophils Relative: 2.1 % (ref 0.0–5.0)
HCT: 38.1 % (ref 36.0–46.0)
Hemoglobin: 12.7 g/dL (ref 12.0–15.0)
Lymphocytes Relative: 25.5 % (ref 12.0–46.0)
Lymphs Abs: 2.4 10*3/uL (ref 0.7–4.0)
MCHC: 33.3 g/dL (ref 30.0–36.0)
MCV: 89.4 fl (ref 78.0–100.0)
Monocytes Absolute: 0.6 10*3/uL (ref 0.1–1.0)
Monocytes Relative: 6.6 % (ref 3.0–12.0)
Neutro Abs: 6 10*3/uL (ref 1.4–7.7)
Neutrophils Relative %: 65.2 % (ref 43.0–77.0)
Platelets: 223 10*3/uL (ref 150.0–400.0)
RBC: 4.26 Mil/uL (ref 3.87–5.11)
RDW: 14.3 % (ref 11.5–15.5)
WBC: 9.2 10*3/uL (ref 4.0–10.5)

## 2022-04-05 LAB — MICROALBUMIN / CREATININE URINE RATIO
Creatinine,U: 177.1 mg/dL
Microalb Creat Ratio: 0.5 mg/g (ref 0.0–30.0)
Microalb, Ur: 0.9 mg/dL (ref 0.0–1.9)

## 2022-04-05 LAB — TSH: TSH: 2.74 u[IU]/mL (ref 0.35–5.50)

## 2022-04-05 LAB — COMPREHENSIVE METABOLIC PANEL
ALT: 26 U/L (ref 0–35)
AST: 19 U/L (ref 0–37)
Albumin: 4.1 g/dL (ref 3.5–5.2)
Alkaline Phosphatase: 86 U/L (ref 39–117)
BUN: 13 mg/dL (ref 6–23)
CO2: 29 mEq/L (ref 19–32)
Calcium: 9.4 mg/dL (ref 8.4–10.5)
Chloride: 101 mEq/L (ref 96–112)
Creatinine, Ser: 0.93 mg/dL (ref 0.40–1.20)
GFR: 68.48 mL/min (ref >60.00)
Glucose, Bld: 105 mg/dL — ABNORMAL HIGH (ref 70–99)
Potassium: 4.2 mEq/L (ref 3.5–5.1)
Sodium: 139 mEq/L (ref 135–145)
Total Bilirubin: 0.4 mg/dL (ref 0.2–1.2)
Total Protein: 7.3 g/dL (ref 6.0–8.3)

## 2022-04-05 LAB — LIPID PANEL
Cholesterol: 212 mg/dL — ABNORMAL HIGH (ref 0–200)
HDL: 57.8 mg/dL (ref >39.00)
LDL Cholesterol: 115 mg/dL — ABNORMAL HIGH (ref 0–99)
NonHDL: 153.87
Total CHOL/HDL Ratio: 4
Triglycerides: 192 mg/dL — ABNORMAL HIGH (ref 0.0–149.0)
VLDL: 38.4 mg/dL (ref 0.0–40.0)

## 2022-04-05 LAB — VITAMIN D 25 HYDROXY (VIT D DEFICIENCY, FRACTURES): VITD: 18.17 ng/mL — ABNORMAL LOW (ref 30.00–100.00)

## 2022-04-05 LAB — HEMOGLOBIN A1C: Hgb A1c MFr Bld: 7 % — ABNORMAL HIGH (ref 4.6–6.5)

## 2022-04-05 LAB — VITAMIN B12: Vitamin B-12: 208 pg/mL — ABNORMAL LOW (ref 211–911)

## 2022-04-24 ENCOUNTER — Other Ambulatory Visit: Payer: Self-pay | Admitting: Internal Medicine

## 2022-10-03 ENCOUNTER — Ambulatory Visit: Payer: No Typology Code available for payment source | Admitting: Internal Medicine

## 2022-10-15 ENCOUNTER — Other Ambulatory Visit: Payer: Self-pay | Admitting: Internal Medicine

## 2022-10-16 ENCOUNTER — Encounter: Payer: Self-pay | Admitting: Internal Medicine

## 2022-10-16 DIAGNOSIS — R7989 Other specified abnormal findings of blood chemistry: Secondary | ICD-10-CM | POA: Insufficient documentation

## 2022-10-16 NOTE — Progress Notes (Unsigned)
Subjective:    Patient ID: Victoria Stout, female    DOB: Jan 11, 1966, 57 y.o.   MRN: [id number]     HPI Victoria Stout is here for follow up of her chronic medical problems.  Recently sick for 3 weeks - just getting over it.    Started walking - plantar fasciitis pain intermittently-she is trying not to walk too far because she knows will flare this up.     Medications and allergies reviewed with patient and updated if appropriate.  Current Outpatient Medications on File Prior to Visit  Medication Sig Dispense Refill   BIOTIN PO Take by mouth.     ciclopirox (PENLAC) 8 % solution Apply topically at bedtime.     Multiple Vitamin (MULTIVITAMIN ADULT PO) Take by mouth.     nystatin ointment (MYCOSTATIN) Apply topically at bedtime.     omeprazole (PRILOSEC) 20 MG capsule Take 20 mg by mouth daily.     venlafaxine XR (EFFEXOR-XR) 150 MG 24 hr capsule TAKE 1 CAPSULE BY MOUTH DAILY WITH BREAKFAST. 90 capsule 3   zolpidem (AMBIEN) 10 MG tablet TAKE 1 TABLET BY MOUTH EVERY DAY AT BEDTIME AS NEEDED FOR SLEEP 30 tablet 2   No current facility-administered medications on file prior to visit.     Review of Systems  Constitutional:  Negative for fever.  Respiratory:  Negative for cough, shortness of breath and wheezing.   Cardiovascular:  Negative for chest pain, palpitations and leg swelling.  Neurological:  Negative for light-headedness and headaches.       Objective:   Vitals:   10/17/22 1047  BP: 132/76  Pulse: 85  Temp: 98.1 F (36.7 C)  SpO2: 98%   BP Readings from Last 3 Encounters:  10/17/22 132/76  04/04/22 126/68  09/27/21 (!) 142/80   Wt Readings from Last 3 Encounters:  10/17/22 225 lb (102.1 kg)  04/04/22 222 lb 6.4 oz (100.9 kg)  09/27/21 225 lb (102.1 kg)   Body mass index is 38.62 kg/m.    Physical Exam Constitutional:      General: She is not in acute distress.    Appearance: Normal appearance.  HENT:     Head: Normocephalic and atraumatic.   Eyes:     Conjunctiva/sclera: Conjunctivae normal.  Cardiovascular:     Rate and Rhythm: Normal rate and regular rhythm.     Heart sounds: Normal heart sounds.  Pulmonary:     Effort: Pulmonary effort is normal. No respiratory distress.     Breath sounds: Normal breath sounds. No wheezing.  Musculoskeletal:     Cervical back: Neck supple.     Right lower leg: No edema.     Left lower leg: No edema.  Lymphadenopathy:     Cervical: No cervical adenopathy.  Skin:    General: Skin is warm and dry.     Findings: No rash.  Neurological:     Mental Status: She is alert. Mental status is at baseline.  Psychiatric:        Mood and Affect: Mood normal.        Behavior: Behavior normal.        Lab Results  Component Value Date   WBC 9.2 04/05/2022   HGB 12.7 04/05/2022   HCT 38.1 04/05/2022   PLT 223.0 04/05/2022   GLUCOSE 105 (H) 04/05/2022   CHOL 212 (H) 04/05/2022   TRIG 192.0 (H) 04/05/2022   HDL 57.80 04/05/2022   LDLDIRECT 139.0 03/18/2021   LDLCALC 115 (H)  04/05/2022   ALT 26 04/05/2022   AST 19 04/05/2022   NA 139 04/05/2022   K 4.2 04/05/2022   CL 101 04/05/2022   CREATININE 0.93 04/05/2022   BUN 13 04/05/2022   CO2 29 04/05/2022   TSH 2.74 04/05/2022   HGBA1C 7.0 (H) 04/05/2022   MICROALBUR 0.9 04/05/2022     Assessment & Plan:    See Problem List for Assessment and Plan of chronic medical problems.

## 2022-10-16 NOTE — Patient Instructions (Addendum)
      Blood work was ordered.   The lab is on the first floor.    Medications changes include :   none     Return in about 6 months (around 04/19/2023) for Physical Exam.

## 2022-10-17 ENCOUNTER — Ambulatory Visit: Payer: No Typology Code available for payment source | Admitting: Internal Medicine

## 2022-10-17 VITALS — BP 132/76 | HR 85 | Temp 98.1°F | Wt 225.0 lb

## 2022-10-17 DIAGNOSIS — E782 Mixed hyperlipidemia: Secondary | ICD-10-CM | POA: Diagnosis not present

## 2022-10-17 DIAGNOSIS — E119 Type 2 diabetes mellitus without complications: Secondary | ICD-10-CM | POA: Diagnosis not present

## 2022-10-17 DIAGNOSIS — E66812 Obesity, class 2: Secondary | ICD-10-CM

## 2022-10-17 DIAGNOSIS — R7989 Other specified abnormal findings of blood chemistry: Secondary | ICD-10-CM

## 2022-10-17 DIAGNOSIS — K219 Gastro-esophageal reflux disease without esophagitis: Secondary | ICD-10-CM | POA: Diagnosis not present

## 2022-10-17 DIAGNOSIS — F5101 Primary insomnia: Secondary | ICD-10-CM | POA: Diagnosis not present

## 2022-10-17 DIAGNOSIS — E559 Vitamin D deficiency, unspecified: Secondary | ICD-10-CM

## 2022-10-17 DIAGNOSIS — Z6838 Body mass index (BMI) 38.0-38.9, adult: Secondary | ICD-10-CM

## 2022-10-17 DIAGNOSIS — F419 Anxiety disorder, unspecified: Secondary | ICD-10-CM

## 2022-10-17 NOTE — Assessment & Plan Note (Signed)
Chronic GERD controlled Continue omeprazole 20 mg daily  

## 2022-10-17 NOTE — Assessment & Plan Note (Addendum)
Chronic Taking B12 daily and MVI Check B12 level

## 2022-10-17 NOTE — Assessment & Plan Note (Signed)
Chronic Regular exercise and healthy diet encouraged Check lipid panel  Currently diet controlled/lifestyle control

## 2022-10-17 NOTE — Assessment & Plan Note (Signed)
Chronic Taking vitamin D daily Check vitamin D level  

## 2022-10-17 NOTE — Assessment & Plan Note (Signed)
Chronic Advised regular exercise, decreased portions Diabetic diet

## 2022-10-17 NOTE — Assessment & Plan Note (Signed)
Chronic Controlled, Stable Continue Ambien 5 mg nightly as needed-does not take nightly, but takes a few nights a week

## 2022-10-17 NOTE — Assessment & Plan Note (Addendum)
Chronic   Lab Results  Component Value Date   HGBA1C 7.0 (H) 04/05/2022   Sugars not ideally controlled Check A1c Continue lifestyle control for now-depending on A1c could consider medication Stressed regular exercise, diabetic diet, weight loss

## 2022-10-17 NOTE — Assessment & Plan Note (Signed)
Chronic Controlled, Stable Continue venlafaxine 150 mg daily 

## 2022-10-18 LAB — VITAMIN D 25 HYDROXY (VIT D DEFICIENCY, FRACTURES): VITD: 22.4 ng/mL — ABNORMAL LOW (ref 30.00–100.00)

## 2022-10-18 LAB — LIPID PANEL
Cholesterol: 237 mg/dL — ABNORMAL HIGH (ref 0–200)
HDL: 59.3 mg/dL (ref >39.00)
NonHDL: 177.38
Total CHOL/HDL Ratio: 4
Triglycerides: 253 mg/dL — ABNORMAL HIGH (ref 0.0–149.0)
VLDL: 50.6 mg/dL — ABNORMAL HIGH (ref 0.0–40.0)

## 2022-10-18 LAB — COMPREHENSIVE METABOLIC PANEL
ALT: 44 U/L — ABNORMAL HIGH (ref 0–35)
AST: 31 U/L (ref 0–37)
Albumin: 4.4 g/dL (ref 3.5–5.2)
Alkaline Phosphatase: 92 U/L (ref 39–117)
BUN: 10 mg/dL (ref 6–23)
CO2: 30 mEq/L (ref 19–32)
Calcium: 9.7 mg/dL (ref 8.4–10.5)
Chloride: 101 mEq/L (ref 96–112)
Creatinine, Ser: 0.92 mg/dL (ref 0.40–1.20)
GFR: 69.12 mL/min (ref >60.00)
Glucose, Bld: 129 mg/dL — ABNORMAL HIGH (ref 70–99)
Potassium: 4.5 mEq/L (ref 3.5–5.1)
Sodium: 140 mEq/L (ref 135–145)
Total Bilirubin: 0.4 mg/dL (ref 0.2–1.2)
Total Protein: 7.5 g/dL (ref 6.0–8.3)

## 2022-10-18 LAB — VITAMIN B12: Vitamin B-12: 1302 pg/mL — ABNORMAL HIGH (ref 211–911)

## 2022-10-18 LAB — LDL CHOLESTEROL, DIRECT: Direct LDL: 131 mg/dL

## 2022-10-18 LAB — HEMOGLOBIN A1C: Hgb A1c MFr Bld: 7 % — ABNORMAL HIGH (ref 4.6–6.5)

## 2022-10-20 ENCOUNTER — Encounter: Payer: Self-pay | Admitting: Internal Medicine

## 2022-10-23 ENCOUNTER — Encounter: Payer: Self-pay | Admitting: Internal Medicine

## 2022-10-23 DIAGNOSIS — R040 Epistaxis: Secondary | ICD-10-CM

## 2022-10-26 MED ORDER — TRULICITY 0.75 MG/0.5ML ~~LOC~~ SOAJ: 0.7500 mg | SUBCUTANEOUS | 0 refills | Status: DC

## 2022-10-26 NOTE — Addendum Note (Signed)
Addended by: Binnie Rail on: 10/26/2022 03:59 PM   Modules accepted: Orders

## 2022-11-08 NOTE — Telephone Encounter (Signed)

## 2022-11-16 ENCOUNTER — Other Ambulatory Visit: Payer: Self-pay | Admitting: Internal Medicine

## 2022-11-16 DIAGNOSIS — Z1231 Encounter for screening mammogram for malignant neoplasm of breast: Secondary | ICD-10-CM

## 2022-11-25 ENCOUNTER — Other Ambulatory Visit: Payer: Self-pay | Admitting: Internal Medicine

## 2022-12-01 ENCOUNTER — Ambulatory Visit
Admission: RE | Admit: 2022-12-01 | Discharge: 2022-12-01 | Disposition: A | Discharge status: Home / Self Care | Payer: No Typology Code available for payment source | Source: Ambulatory Visit | Attending: Internal Medicine

## 2022-12-01 DIAGNOSIS — Z1231 Encounter for screening mammogram for malignant neoplasm of breast: Secondary | ICD-10-CM

## 2023-01-11 ENCOUNTER — Other Ambulatory Visit: Payer: Self-pay | Admitting: Internal Medicine

## 2023-02-10 ENCOUNTER — Other Ambulatory Visit: Payer: Self-pay | Admitting: Internal Medicine

## 2023-03-08 ENCOUNTER — Encounter (INDEPENDENT_AMBULATORY_CARE_PROVIDER_SITE_OTHER): Payer: Self-pay

## 2023-03-11 ENCOUNTER — Other Ambulatory Visit: Payer: Self-pay | Admitting: Internal Medicine

## 2023-03-12 ENCOUNTER — Other Ambulatory Visit: Payer: Self-pay | Admitting: Internal Medicine

## 2023-04-08 ENCOUNTER — Other Ambulatory Visit: Payer: Self-pay | Admitting: Internal Medicine

## 2023-04-11 ENCOUNTER — Encounter: Payer: No Typology Code available for payment source | Admitting: Internal Medicine

## 2023-04-27 ENCOUNTER — Other Ambulatory Visit: Payer: Self-pay | Admitting: Internal Medicine

## 2023-04-27 DIAGNOSIS — Z1211 Encounter for screening for malignant neoplasm of colon: Secondary | ICD-10-CM

## 2023-04-27 DIAGNOSIS — Z1212 Encounter for screening for malignant neoplasm of rectum: Secondary | ICD-10-CM

## 2023-04-29 ENCOUNTER — Encounter: Payer: Self-pay | Admitting: Internal Medicine

## 2023-04-29 NOTE — Patient Instructions (Addendum)
An EKG was done today    Blood work was ordered.   The lab is on the first floor.    Medications changes include :   none     Return in about 6 months (around 10/29/2023) for follow up.   Health Maintenance, Female Adopting a healthy lifestyle and getting preventive care are important in promoting health and wellness. Ask your health care provider about: The right schedule for you to have regular tests and exams. Things you can do on your own to prevent diseases and keep yourself healthy. What should I know about diet, weight, and exercise? Eat a healthy diet  Eat a diet that includes plenty of vegetables, fruits, low-fat dairy products, and lean protein. Do not eat a lot of foods that are high in solid fats, added sugars, or sodium. Maintain a healthy weight Body mass index (BMI) is used to identify weight problems. It estimates body fat based on height and weight. Your health care provider can help determine your BMI and help you achieve or maintain a healthy weight. Get regular exercise Get regular exercise. This is one of the most important things you can do for your health. Most adults should: Exercise for at least 150 minutes each week. The exercise should increase your heart rate and make you sweat (moderate-intensity exercise). Do strengthening exercises at least twice a week. This is in addition to the moderate-intensity exercise. Spend less time sitting. Even light physical activity can be beneficial. Watch cholesterol and blood lipids Have your blood tested for lipids and cholesterol at 58 years of age, then have this test every 5 years. Have your cholesterol levels checked more often if: Your lipid or cholesterol levels are high. You are older than 58 years of age. You are at high risk for heart disease. What should I know about cancer screening? Depending on your health history and family history, you may need to have cancer screening at various ages. This may  include screening for: Breast cancer. Cervical cancer. Colorectal cancer. Skin cancer. Lung cancer. What should I know about heart disease, diabetes, and high blood pressure? Blood pressure and heart disease High blood pressure causes heart disease and increases the risk of stroke. This is more likely to develop in people who have high blood pressure readings or are overweight. Have your blood pressure checked: Every 3-5 years if you are 98-67 years of age. Every year if you are 60 years old or older. Diabetes Have regular diabetes screenings. This checks your fasting blood sugar level. Have the screening done: Once every three years after age 33 if you are at a normal weight and have a low risk for diabetes. More often and at a younger age if you are overweight or have a high risk for diabetes. What should I know about preventing infection? Hepatitis B If you have a higher risk for hepatitis B, you should be screened for this virus. Talk with your health care provider to find out if you are at risk for hepatitis B infection. Hepatitis C Testing is recommended for: Everyone born from [birth year] through 1966. Anyone with known risk factors for hepatitis C. Sexually transmitted infections (STIs) Get screened for STIs, including gonorrhea and chlamydia, if: You are sexually active and are younger than 58 years of age. You are older than 58 years of age and your health care provider tells you that you are at risk for this type of infection. Your sexual activity has changed since you were  last screened, and you are at increased risk for chlamydia or gonorrhea. Ask your health care provider if you are at risk. Ask your health care provider about whether you are at high risk for HIV. Your health care provider may recommend a prescription medicine to help prevent HIV infection. If you choose to take medicine to prevent HIV, you should first get tested for HIV. You should then be tested every 3 months  for as long as you are taking the medicine. Pregnancy If you are about to stop having your period (premenopausal) and you may become pregnant, seek counseling before you get pregnant. Take 400 to 800 micrograms (mcg) of folic acid every day if you become pregnant. Ask for birth control (contraception) if you want to prevent pregnancy. Osteoporosis and menopause Osteoporosis is a disease in which the bones lose minerals and strength with aging. This can result in bone fractures. If you are 49 years old or older, or if you are at risk for osteoporosis and fractures, ask your health care provider if you should: Be screened for bone loss. Take a calcium or vitamin D supplement to lower your risk of fractures. Be given hormone replacement therapy (HRT) to treat symptoms of menopause. Follow these instructions at home: Alcohol use Do not drink alcohol if: Your health care provider tells you not to drink. You are pregnant, may be pregnant, or are planning to become pregnant. If you drink alcohol: Limit how much you have to: 0-1 drink a day. Know how much alcohol is in your drink. In the [place] one drink equals one 12 oz bottle of beer (355 mL), one 5 oz glass of wine (148 mL), or one 1 oz glass of hard liquor (44 mL). Lifestyle Do not use any products that contain nicotine or tobacco. These products include cigarettes, chewing tobacco, and vaping devices, such as e-cigarettes. If you need help quitting, ask your health care provider. Do not use street drugs. Do not share needles. Ask your health care provider for help if you need support or information about quitting drugs. General instructions Schedule regular health, dental, and eye exams. Stay current with your vaccines. Tell your health care provider if: You often feel depressed. You have ever been abused or do not feel safe at home. Summary Adopting a healthy lifestyle and getting preventive care are important in promoting health and  wellness. Follow your health care provider's instructions about healthy diet, exercising, and getting tested or screened for diseases. Follow your health care provider's instructions on monitoring your cholesterol and blood pressure. This information is not intended to replace advice given to you by your health care provider. Make sure you discuss any questions you have with your health care provider. Document Revised: 11/29/2020 Document Reviewed: 11/29/2020 Elsevier Patient Education  2024 [employer].

## 2023-04-29 NOTE — Progress Notes (Unsigned)
Subjective:    Patient ID: Victoria Stout, female    DOB: [birth year], 57 y.o.   MRN: [id number]      HPI Victoria Stout is here for a Physical exam and her chronic medical problems.   Right lower back pain - worse with certain movements - intermittent.  Pain is intermittent.   Medications and allergies reviewed with patient and updated if appropriate.  Current Outpatient Medications on File Prior to Visit  Medication Sig Dispense Refill   BIOTIN PO Take by mouth.     ciclopirox (PENLAC) 8 % solution Apply topically at bedtime.     Dulaglutide (TRULICITY) 0.75 MG/0.5ML SOPN INJECT 0.75 MG SUBCUTANEOUSLY ONE TIME PER WEEK 6 mL 0   Multiple Vitamin (MULTIVITAMIN ADULT PO) Take by mouth.     nystatin ointment (MYCOSTATIN) Apply topically at bedtime.     omeprazole (PRILOSEC) 20 MG capsule Take 20 mg by mouth daily.     venlafaxine XR (EFFEXOR-XR) 150 MG 24 hr capsule TAKE 1 CAPSULE BY MOUTH DAILY WITH BREAKFAST. 90 capsule 3   zolpidem (AMBIEN) 10 MG tablet TAKE 1 TABLET BY MOUTH AT BEDTIME AS NEEDED FOR SLEEP 30 tablet 3   No current facility-administered medications on file prior to visit.    Review of Systems  Constitutional:  Negative for fever.  Eyes:  Negative for visual disturbance.  Respiratory:  Negative for cough, shortness of breath and wheezing.   Cardiovascular:  Negative for chest pain, palpitations and leg swelling.  Gastrointestinal:  Positive for abdominal pain (at times). Negative for blood in stool, constipation and diarrhea.       No gerd  Genitourinary:  Negative for dysuria.  Musculoskeletal:  Positive for back pain. Negative for arthralgias.  Skin:  Negative for rash.  Neurological:  Negative for light-headedness and headaches.  Psychiatric/Behavioral:  Negative for dysphoric mood. The patient is nervous/anxious.        Objective:   Vitals:   04/30/23 1527  BP: 130/70  Pulse: 69  Temp: 98.3 F (36.8 C)  SpO2: 99%   Filed Weights   04/30/23 1527   Weight: 232 lb (105.2 kg)   Body mass index is 39.82 kg/m.  BP Readings from Last 3 Encounters:  04/30/23 130/70  10/17/22 132/76  04/04/22 126/68    Wt Readings from Last 3 Encounters:  04/30/23 232 lb (105.2 kg)  10/17/22 225 lb (102.1 kg)  04/04/22 222 lb 6.4 oz (100.9 kg)       Physical Exam Constitutional: She appears well-developed and well-nourished. No distress.  HENT:  Head: Normocephalic and atraumatic.  Right Ear: External ear normal. Normal ear canal and TM Left Ear: External ear normal.  Normal ear canal and TM Mouth/Throat: Oropharynx is clear and moist.  Eyes: Conjunctivae normal.  Neck: Neck supple. No tracheal deviation present. No thyromegaly present.  No carotid bruit  Cardiovascular: Normal rate, regular rhythm and normal heart sounds.   No murmur heard.  No edema. Pulmonary/Chest: Effort normal and breath sounds normal. No respiratory distress. She has no wheezes. She has no rales.  Breast: deferred   Abdominal: Soft. She exhibits no distension. There is no tenderness.  Lymphadenopathy: She has no cervical adenopathy.  Skin: Skin is warm and dry. She is not diaphoretic.  Psychiatric: She has a normal mood and affect. Her behavior is normal.     Lab Results  Component Value Date   WBC 9.2 04/05/2022   HGB 12.7 04/05/2022   HCT 38.1 04/05/2022  PLT 223.0 04/05/2022   GLUCOSE 129 (H) 10/18/2022   CHOL 237 (H) 10/18/2022   TRIG 253.0 (H) 10/18/2022   HDL 59.30 10/18/2022   LDLDIRECT 131.0 10/18/2022   LDLCALC 115 (H) 04/05/2022   ALT 44 (H) 10/18/2022   AST 31 10/18/2022   NA 140 10/18/2022   K 4.5 10/18/2022   CL 101 10/18/2022   CREATININE 0.92 10/18/2022   BUN 10 10/18/2022   CO2 30 10/18/2022   TSH 2.74 04/05/2022   HGBA1C 7.0 (H) 10/18/2022   MICROALBUR 0.9 04/05/2022         Assessment & Plan:   Physical exam: Screening blood work  ordered Exercise  not regular - having foot pain -- getting better so will  restart Weight  obese - encouraged weight loss Substance abuse  none   Reviewed recommended immunizations.   Health Maintenance  Topic Date Due   FOOT EXAM  Never done   OPHTHALMOLOGY EXAM  Never done   Fecal DNA (Cologuard)  Never done   Cervical Cancer Screening (HPV/Pap Cotest)  05/15/2020   INFLUENZA VACCINE  Never done   COVID-19 Vaccine (3 - 2023-24 season) 03/25/2023   Diabetic kidney evaluation - Urine ACR  04/06/2023   HEMOGLOBIN A1C  04/20/2023   Hepatitis C Screening  03/08/2049 (Originally 04/14/1983)   HIV Screening  03/08/2049 (Originally 04/13/1980)   Diabetic kidney evaluation - eGFR measurement  10/18/2023   MAMMOGRAM  11/30/2024   DTaP/Tdap/Td (2 - Td or Tdap) 09/07/2029   Zoster Vaccines- Shingrix  Completed   HPV VACCINES  Aged Out          See Problem List for Assessment and Plan of chronic medical problems.

## 2023-04-30 ENCOUNTER — Ambulatory Visit (INDEPENDENT_AMBULATORY_CARE_PROVIDER_SITE_OTHER): Payer: No Typology Code available for payment source | Admitting: Internal Medicine

## 2023-04-30 VITALS — BP 130/70 | HR 69 | Temp 98.3°F | Ht 64.0 in | Wt 232.0 lb

## 2023-04-30 DIAGNOSIS — Z6839 Body mass index (BMI) 39.0-39.9, adult: Secondary | ICD-10-CM

## 2023-04-30 DIAGNOSIS — K219 Gastro-esophageal reflux disease without esophagitis: Secondary | ICD-10-CM | POA: Diagnosis not present

## 2023-04-30 DIAGNOSIS — E559 Vitamin D deficiency, unspecified: Secondary | ICD-10-CM

## 2023-04-30 DIAGNOSIS — Z7985 Long-term (current) use of injectable non-insulin antidiabetic drugs: Secondary | ICD-10-CM

## 2023-04-30 DIAGNOSIS — E1169 Type 2 diabetes mellitus with other specified complication: Secondary | ICD-10-CM | POA: Diagnosis not present

## 2023-04-30 DIAGNOSIS — F419 Anxiety disorder, unspecified: Secondary | ICD-10-CM

## 2023-04-30 DIAGNOSIS — E782 Mixed hyperlipidemia: Secondary | ICD-10-CM

## 2023-04-30 DIAGNOSIS — Z0000 Encounter for general adult medical examination without abnormal findings: Secondary | ICD-10-CM

## 2023-04-30 DIAGNOSIS — E66812 Obesity, class 2: Secondary | ICD-10-CM

## 2023-04-30 DIAGNOSIS — Z1211 Encounter for screening for malignant neoplasm of colon: Secondary | ICD-10-CM

## 2023-04-30 DIAGNOSIS — R7989 Other specified abnormal findings of blood chemistry: Secondary | ICD-10-CM

## 2023-04-30 DIAGNOSIS — F5101 Primary insomnia: Secondary | ICD-10-CM

## 2023-04-30 NOTE — Assessment & Plan Note (Signed)
Chronic   Lab Results  Component Value Date   HGBA1C 7.0 (H) 10/18/2022   Sugars not ideally controlled Check A1c, urine microalbumin Continue Trulicity 0.75 mg weekly-was off of this for couple of months secondary to supply issues-discussed we can increase the dose if she wishes which will help keep sugars controlled and may help with weight loss Stressed regular exercise, diabetic diet, weight loss

## 2023-04-30 NOTE — Assessment & Plan Note (Signed)
History of elevated TSH Check TSH today Clinically euthyroid

## 2023-04-30 NOTE — Assessment & Plan Note (Signed)
Chronic Taking B12 daily and MVI Check B12 level

## 2023-04-30 NOTE — Assessment & Plan Note (Signed)
Chronic Regular exercise and healthy diet encouraged Check lipid panel  Currently diet controlled/lifestyle control Depending on LDL may need to consider statin  EKG: NSR at 84 bpm, normal EKG.  No previous EKG for comparison

## 2023-04-30 NOTE — Assessment & Plan Note (Signed)
Chronic GERD controlled Continue omeprazole 20 mg daily  

## 2023-04-30 NOTE — Assessment & Plan Note (Addendum)
Chronic Not able to exercise currently due to foot pain, but will restart when stable Currently on Trulicity 0.75 mg weekly-discussed that we can increase this with her next refill if she wishes to help aid in weight loss

## 2023-04-30 NOTE — Assessment & Plan Note (Signed)
Chronic Controlled, Stable Continue venlafaxine 150 mg daily 

## 2023-04-30 NOTE — Assessment & Plan Note (Signed)
Chronic Controlled, Stable Continue Ambien 5 mg nightly as needed-does not take nightly, but takes a few nights a week 

## 2023-04-30 NOTE — Assessment & Plan Note (Signed)
Chronic Taking vitamin D daily Check vitamin D level  

## 2023-05-01 LAB — COMPREHENSIVE METABOLIC PANEL
ALT: 46 U/L — ABNORMAL HIGH (ref 0–35)
AST: 29 U/L (ref 0–37)
Albumin: 4.2 g/dL (ref 3.5–5.2)
Alkaline Phosphatase: 79 U/L (ref 39–117)
BUN: 9 mg/dL (ref 6–23)
CO2: 30 meq/L (ref 19–32)
Calcium: 9.5 mg/dL (ref 8.4–10.5)
Chloride: 103 meq/L (ref 96–112)
Creatinine, Ser: 0.94 mg/dL (ref 0.40–1.20)
GFR: 67.1 mL/min (ref >60.00)
Glucose, Bld: 102 mg/dL — ABNORMAL HIGH (ref 70–99)
Potassium: 4.5 meq/L (ref 3.5–5.1)
Sodium: 143 meq/L (ref 135–145)
Total Bilirubin: 0.3 mg/dL (ref 0.2–1.2)
Total Protein: 7.1 g/dL (ref 6.0–8.3)

## 2023-05-01 LAB — MICROALBUMIN / CREATININE URINE RATIO
Creatinine,U: 215 mg/dL
Microalb Creat Ratio: 0.5 mg/g (ref 0.0–30.0)
Microalb, Ur: 1.1 mg/dL (ref 0.0–1.9)

## 2023-05-01 LAB — CBC WITH DIFFERENTIAL/PLATELET
Basophils Absolute: 0 10*3/uL (ref 0.0–0.1)
Basophils Relative: 0.4 % (ref 0.0–3.0)
Eosinophils Absolute: 0.2 10*3/uL (ref 0.0–0.7)
Eosinophils Relative: 2.9 % (ref 0.0–5.0)
HCT: 42 % (ref 36.0–46.0)
Hemoglobin: 13.4 g/dL (ref 12.0–15.0)
Lymphocytes Relative: 47.1 % — ABNORMAL HIGH (ref 12.0–46.0)
Lymphs Abs: 4 10*3/uL (ref 0.7–4.0)
MCHC: 31.9 g/dL (ref 30.0–36.0)
MCV: 92.9 fL (ref 78.0–100.0)
Monocytes Absolute: 0.5 10*3/uL (ref 0.1–1.0)
Monocytes Relative: 6.3 % (ref 3.0–12.0)
Neutro Abs: 3.6 10*3/uL (ref 1.4–7.7)
Neutrophils Relative %: 43.3 % (ref 43.0–77.0)
Platelets: 258 10*3/uL (ref 150.0–400.0)
RBC: 4.52 Mil/uL (ref 3.87–5.11)
RDW: 14.6 % (ref 11.5–15.5)
WBC: 8.4 10*3/uL (ref 4.0–10.5)

## 2023-05-01 LAB — LIPID PANEL
Cholesterol: 227 mg/dL — ABNORMAL HIGH (ref 0–200)
HDL: 55.4 mg/dL (ref >39.00)
LDL Cholesterol: 129 mg/dL — ABNORMAL HIGH (ref 0–99)
NonHDL: 171.8
Total CHOL/HDL Ratio: 4
Triglycerides: 214 mg/dL — ABNORMAL HIGH (ref 0.0–149.0)
VLDL: 42.8 mg/dL — ABNORMAL HIGH (ref 0.0–40.0)

## 2023-05-01 LAB — VITAMIN B12: Vitamin B-12: 778 pg/mL (ref 211–911)

## 2023-05-01 LAB — TSH: TSH: 1.72 u[IU]/mL (ref 0.35–5.50)

## 2023-05-01 LAB — HEMOGLOBIN A1C: Hgb A1c MFr Bld: 6.5 % (ref 4.6–6.5)

## 2023-05-01 LAB — VITAMIN D 25 HYDROXY (VIT D DEFICIENCY, FRACTURES): VITD: 19.05 ng/mL — ABNORMAL LOW (ref 30.00–100.00)

## 2023-05-04 ENCOUNTER — Encounter: Payer: Self-pay | Admitting: Internal Medicine

## 2023-05-04 DIAGNOSIS — E7800 Pure hypercholesterolemia, unspecified: Secondary | ICD-10-CM

## 2023-05-07 ENCOUNTER — Other Ambulatory Visit: Payer: Self-pay | Admitting: Internal Medicine

## 2023-05-08 MED ORDER — ROSUVASTATIN CALCIUM 5 MG PO TABS: 5.0000 mg | ORAL_TABLET | Freq: Every day | ORAL | 3 refills | Status: DC

## 2023-06-30 ENCOUNTER — Other Ambulatory Visit: Payer: Self-pay | Admitting: Internal Medicine

## 2023-07-05 ENCOUNTER — Other Ambulatory Visit: Payer: Self-pay | Admitting: Internal Medicine

## 2023-07-24 ENCOUNTER — Telehealth: Payer: Self-pay

## 2023-07-24 ENCOUNTER — Other Ambulatory Visit (HOSPITAL_COMMUNITY): Payer: Self-pay

## 2023-07-24 NOTE — Telephone Encounter (Signed)
 Pharmacy Patient Advocate Encounter   Received notification from CoverMyMeds that prior authorization for Trulicity  0.75MG /0.5ML auto-injectors is required/requested.   Insurance verification completed.   The patient is insured through [employer] .   Per test claim: PA required; PA submitted to above mentioned insurance via CoverMyMeds Key/confirmation #/EOC [id number] Status is pending

## 2023-07-24 NOTE — Telephone Encounter (Signed)
 Pharmacy Patient Advocate Encounter  Received notification from [employer] that Prior Authorization for Trulicity 0.75MG /0.5ML auto-injectors  has been APPROVED from 07/24/23 to 07/23/26   PA #/Case ID/Reference #: [id number]

## 2023-10-28 ENCOUNTER — Encounter: Payer: Self-pay | Admitting: Internal Medicine

## 2023-10-28 NOTE — Progress Notes (Unsigned)
 Subjective:    Patient ID: Victoria Stout, female    DOB: 10-17-1965, 58 y.o.   MRN: [id number]     HPI Victoria Stout is here for follow up of her chronic medical problems.  Trying to walk a little every day.  Trying to decrease her portions and eat healthy.  She ran out of the Trulicity and the pharmacy said they could not refill it.  She wondered about trying a different medication such as Ozempic-a friend of hers is on it and has had good luck with it.  Medications and allergies reviewed with patient and updated if appropriate.  Current Outpatient Medications on File Prior to Visit  Medication Sig Dispense Refill   BIOTIN PO Take by mouth.     ciclopirox (PENLAC) 8 % solution Apply topically at bedtime.     Dulaglutide (TRULICITY) 0.75 MG/0.5ML SOAJ INJECT 0.75 MG SUBCUTANEOUSLY ONE TIME PER WEEK 6 mL 1   Multiple Vitamin (MULTIVITAMIN ADULT PO) Take by mouth.     nystatin ointment (MYCOSTATIN) Apply topically at bedtime.     omeprazole (PRILOSEC) 20 MG capsule Take 20 mg by mouth daily.     rosuvastatin (CRESTOR) 5 MG tablet Take 1 tablet (5 mg total) by mouth daily. 90 tablet 3   venlafaxine XR (EFFEXOR-XR) 150 MG 24 hr capsule TAKE 1 CAPSULE BY MOUTH DAILY WITH BREAKFAST. 90 capsule 3   zolpidem (AMBIEN) 10 MG tablet TAKE 1 TABLET BY MOUTH AT BEDTIME AS NEEDED FOR SLEEP 30 tablet 5   No current facility-administered medications on file prior to visit.     Review of Systems  Constitutional:  Negative for fever.  Respiratory:  Positive for cough (allergy related). Negative for shortness of breath and wheezing.   Cardiovascular:  Negative for chest pain, palpitations and leg swelling.  Neurological:  Positive for headaches (allergy related?). Negative for light-headedness.       Objective:   Vitals:   10/29/23 1454  BP: 132/78  Pulse: 70  Temp: 98.4 F (36.9 C)  SpO2: 97%   BP Readings from Last 3 Encounters:  10/29/23 132/78  04/30/23 130/70  10/17/22 132/76    Wt Readings from Last 3 Encounters:  10/29/23 227 lb (103 kg)  04/30/23 232 lb (105.2 kg)  10/17/22 225 lb (102.1 kg)   Body mass index is 38.96 kg/m.    Physical Exam Constitutional:      General: She is not in acute distress.    Appearance: Normal appearance.  HENT:     Head: Normocephalic and atraumatic.  Eyes:     Conjunctiva/sclera: Conjunctivae normal.  Cardiovascular:     Rate and Rhythm: Normal rate and regular rhythm.     Heart sounds: Normal heart sounds.  Pulmonary:     Effort: Pulmonary effort is normal. No respiratory distress.     Breath sounds: Normal breath sounds. No wheezing.  Musculoskeletal:     Cervical back: Neck supple.     Right lower leg: No edema.     Left lower leg: No edema.  Lymphadenopathy:     Cervical: No cervical adenopathy.  Skin:    General: Skin is warm and dry.     Findings: No rash.  Neurological:     Mental Status: She is alert. Mental status is at baseline.  Psychiatric:        Mood and Affect: Mood normal.        Behavior: Behavior normal.        Lab  Results  Component Value Date   WBC 8.4 05/01/2023   HGB 13.4 05/01/2023   HCT 42.0 05/01/2023   PLT 258.0 05/01/2023   GLUCOSE 102 (H) 05/01/2023   CHOL 227 (H) 05/01/2023   TRIG 214.0 (H) 05/01/2023   HDL 55.40 05/01/2023   LDLDIRECT 131.0 10/18/2022   LDLCALC 129 (H) 05/01/2023   ALT 46 (H) 05/01/2023   AST 29 05/01/2023   NA 143 05/01/2023   K 4.5 05/01/2023   CL 103 05/01/2023   CREATININE 0.94 05/01/2023   BUN 9 05/01/2023   CO2 30 05/01/2023   TSH 1.72 05/01/2023   HGBA1C 6.5 05/01/2023   MICROALBUR 1.1 05/01/2023     Assessment & Plan:    See Problem List for Assessment and Plan of chronic medical problems.

## 2023-10-28 NOTE — Patient Instructions (Addendum)
      infections (STIs) Get screened for STIs, including gonorrhea and chlamydia, if: You are sexually active and are younger than 59 years of age. You are older than 59 years of age and your health care provider tells you that you are at risk for this type of infection. Your sexual activity has changed since you were last screened, and you are at increased risk for chlamydia or gonorrhea. Ask your health care provider if you are at risk. Ask your health care provider about whether you are at high risk for HIV. Your health care provider may recommend a  prescription medicine to help prevent HIV infection. If you choose to take medicine to prevent HIV, you should first get tested for HIV. You should then be tested every 3 months for as long as you are taking the medicine. Pregnancy If you are about to stop having your period (premenopausal) and you may become pregnant, seek counseling before you get pregnant. Take 400 to 800 micrograms (mcg) of folic acid every day if you become pregnant. Ask for birth control (contraception) if you want to prevent pregnancy. Osteoporosis and menopause Osteoporosis is a disease in which the bones lose minerals and strength with aging. This can result in bone fractures. If you are 24 years old or older, or if you are at risk for osteoporosis and fractures, ask your health care provider if you should: Be screened for bone loss. Take a calcium or vitamin D supplement to lower your risk of fractures. Be given hormone replacement therapy (HRT) to treat symptoms of menopause. Follow these instructions at home: Alcohol use Do not drink alcohol if: Your health care provider tells you not to drink. You are pregnant, may be pregnant, or are planning to become pregnant. If you drink alcohol: Limit how much you have to: 0-1 drink a day. Know how much alcohol is in your drink. In the [place] one drink equals one 12 oz bottle of beer (355 mL), one 5 oz glass of wine (148 mL), or one 1 oz glass of hard liquor (44 mL). Lifestyle Do not use any products that contain nicotine or tobacco. These products include cigarettes, chewing tobacco, and vaping devices, such as e-cigarettes. If you need help quitting, ask your health care provider. Do not use street drugs. Do not share needles. Ask your health care provider for help if you need support or information about quitting drugs. General instructions Schedule regular health, dental, and eye exams. Stay current with your vaccines. Tell your health care provider if: You often  feel depressed. You have ever been abused or do not feel safe at home. Summary Adopting a healthy lifestyle and getting preventive care are important in promoting health and wellness. Follow your health care provider's instructions about healthy diet, exercising, and getting tested or screened for diseases. Follow your health care provider's instructions on monitoring your cholesterol and blood pressure. This information is not intended to replace advice given to you by your health care provider. Make sure you discuss any questions you have with your health care provider. Document Revised: 11/29/2020 Document Reviewed: 11/29/2020 Elsevier Patient Education  2024 [employer].

## 2023-10-29 ENCOUNTER — Ambulatory Visit: Payer: No Typology Code available for payment source | Admitting: Internal Medicine

## 2023-10-29 ENCOUNTER — Other Ambulatory Visit (HOSPITAL_COMMUNITY): Payer: Self-pay

## 2023-10-29 ENCOUNTER — Telehealth: Payer: Self-pay

## 2023-10-29 VITALS — BP 132/78 | HR 70 | Temp 98.4°F | Ht 64.0 in | Wt 227.0 lb

## 2023-10-29 DIAGNOSIS — E1169 Type 2 diabetes mellitus with other specified complication: Secondary | ICD-10-CM

## 2023-10-29 DIAGNOSIS — K219 Gastro-esophageal reflux disease without esophagitis: Secondary | ICD-10-CM | POA: Diagnosis not present

## 2023-10-29 DIAGNOSIS — F419 Anxiety disorder, unspecified: Secondary | ICD-10-CM

## 2023-10-29 DIAGNOSIS — E782 Mixed hyperlipidemia: Secondary | ICD-10-CM | POA: Diagnosis not present

## 2023-10-29 DIAGNOSIS — E559 Vitamin D deficiency, unspecified: Secondary | ICD-10-CM | POA: Diagnosis not present

## 2023-10-29 DIAGNOSIS — F5101 Primary insomnia: Secondary | ICD-10-CM

## 2023-10-29 DIAGNOSIS — R7989 Other specified abnormal findings of blood chemistry: Secondary | ICD-10-CM | POA: Diagnosis not present

## 2023-10-29 DIAGNOSIS — E66812 Obesity, class 2: Secondary | ICD-10-CM

## 2023-10-29 DIAGNOSIS — Z6839 Body mass index (BMI) 39.0-39.9, adult: Secondary | ICD-10-CM

## 2023-10-29 DIAGNOSIS — Z7985 Long-term (current) use of injectable non-insulin antidiabetic drugs: Secondary | ICD-10-CM

## 2023-10-29 LAB — COMPREHENSIVE METABOLIC PANEL WITH GFR
ALT: 37 U/L — ABNORMAL HIGH (ref 0–35)
AST: 31 U/L (ref 0–37)
Albumin: 4.7 g/dL (ref 3.5–5.2)
Alkaline Phosphatase: 105 U/L (ref 39–117)
BUN: 10 mg/dL (ref 6–23)
CO2: 31 meq/L (ref 19–32)
Calcium: 9.3 mg/dL (ref 8.4–10.5)
Chloride: 101 meq/L (ref 96–112)
Creatinine, Ser: 0.91 mg/dL (ref 0.40–1.20)
GFR: 69.52 mL/min (ref >60.00)
Glucose, Bld: 105 mg/dL — ABNORMAL HIGH (ref 70–99)
Potassium: 4.3 meq/L (ref 3.5–5.1)
Sodium: 142 meq/L (ref 135–145)
Total Bilirubin: 0.3 mg/dL (ref 0.2–1.2)
Total Protein: 7.6 g/dL (ref 6.0–8.3)

## 2023-10-29 LAB — LIPID PANEL
Cholesterol: 178 mg/dL (ref 0–200)
HDL: 55.2 mg/dL (ref >39.00)
LDL Cholesterol: 75 mg/dL (ref 0–99)
NonHDL: 122.57
Total CHOL/HDL Ratio: 3
Triglycerides: 238 mg/dL — ABNORMAL HIGH (ref 0.0–149.0)
VLDL: 47.6 mg/dL — ABNORMAL HIGH (ref 0.0–40.0)

## 2023-10-29 LAB — CBC WITH DIFFERENTIAL/PLATELET
Basophils Absolute: 0 10*3/uL (ref 0.0–0.1)
Basophils Relative: 0.5 % (ref 0.0–3.0)
Eosinophils Absolute: 0.4 10*3/uL (ref 0.0–0.7)
Eosinophils Relative: 4.5 % (ref 0.0–5.0)
HCT: 38.9 % (ref 36.0–46.0)
Hemoglobin: 12.9 g/dL (ref 12.0–15.0)
Lymphocytes Relative: 47.7 % — ABNORMAL HIGH (ref 12.0–46.0)
Lymphs Abs: 4 10*3/uL (ref 0.7–4.0)
MCHC: 33.2 g/dL (ref 30.0–36.0)
MCV: 91 fl (ref 78.0–100.0)
Monocytes Absolute: 0.6 10*3/uL (ref 0.1–1.0)
Monocytes Relative: 7.1 % (ref 3.0–12.0)
Neutro Abs: 3.4 10*3/uL (ref 1.4–7.7)
Neutrophils Relative %: 40.2 % — ABNORMAL LOW (ref 43.0–77.0)
Platelets: 272 10*3/uL (ref 150.0–400.0)
RBC: 4.27 Mil/uL (ref 3.87–5.11)
RDW: 13.9 % (ref 11.5–15.5)
WBC: 8.3 10*3/uL (ref 4.0–10.5)

## 2023-10-29 LAB — VITAMIN D 25 HYDROXY (VIT D DEFICIENCY, FRACTURES): VITD: 24.73 ng/mL — ABNORMAL LOW (ref 30.00–100.00)

## 2023-10-29 LAB — TSH: TSH: 1.33 u[IU]/mL (ref 0.35–5.50)

## 2023-10-29 LAB — HEMOGLOBIN A1C: Hgb A1c MFr Bld: 6.7 % — ABNORMAL HIGH (ref 4.6–6.5)

## 2023-10-29 LAB — VITAMIN B12: Vitamin B-12: 943 pg/mL — ABNORMAL HIGH (ref 211–911)

## 2023-10-29 MED ORDER — OZEMPIC (0.25 OR 0.5 MG/DOSE) 2 MG/1.5ML ~~LOC~~ SOPN: PEN_INJECTOR | SUBCUTANEOUS | 0 refills | Status: DC

## 2023-10-29 MED ORDER — ROSUVASTATIN CALCIUM 5 MG PO TABS: 5.0000 mg | ORAL_TABLET | Freq: Every day | ORAL | 3 refills | Status: AC

## 2023-10-29 NOTE — Assessment & Plan Note (Signed)
 Chronic Regular exercise and healthy diet encouraged Lab Results  Component Value Date   LDLCALC 129 (H) 05/01/2023   Check lipid panel, cmp, tsh  Continue Crestor 5 mg daily

## 2023-10-29 NOTE — Assessment & Plan Note (Signed)
Chronic Taking B12 daily and MVI Check B12 level

## 2023-10-29 NOTE — Assessment & Plan Note (Signed)
Chronic GERD controlled Continue omeprazole 20 mg daily  

## 2023-10-29 NOTE — Assessment & Plan Note (Signed)
 Chronic Taking vitamin D daily Check vitamin D level

## 2023-10-29 NOTE — Assessment & Plan Note (Addendum)
 Chronic Encouraged regular exercise Did not have luck with Trulicity-interested in trying Ozempic which I did prescribe Discussed side effects Discussed that the only thing that works really long-term is lifestyle changes

## 2023-10-29 NOTE — Assessment & Plan Note (Addendum)
 Chronic  Lab Results  Component Value Date   HGBA1C 6.5 05/01/2023   Sugars controlled Check A1c Start ozempic 0.25 mg x 4 weeks then 0.5 mg weekly-discussed possible side effects including nausea, constipation, GERD, diarrhea, increased risk of pancreatitis and vision issues Stressed regular exercise, diabetic diet

## 2023-10-29 NOTE — Assessment & Plan Note (Signed)
Chronic Controlled, Stable Continue Ambien 5 mg nightly as needed-does not take nightly, but takes a few nights a week 

## 2023-10-29 NOTE — Assessment & Plan Note (Signed)
Chronic Controlled, Stable Continue venlafaxine 150 mg daily 

## 2023-10-29 NOTE — Telephone Encounter (Signed)
 Pharmacy Patient Advocate Encounter   Received notification from CoverMyMeds that prior authorization for Ozempic (0.25 or 0.5 MG/DOSE) 2MG /3ML pen-injectors is required/requested.   Insurance verification completed.   The patient is insured through [facility]   Per test claim: PA required; PA submitted to above mentioned insurance via CoverMyMeds Key/confirmation #/EOC (Key: [id number])    Status is pending

## 2023-10-31 ENCOUNTER — Other Ambulatory Visit (HOSPITAL_COMMUNITY): Payer: Self-pay

## 2023-10-31 NOTE — Telephone Encounter (Signed)
 Pharmacy Patient Advocate Encounter  Received notification from [facility] that Prior Authorization for Ozempic (0.25 or 0.5 MG/DOSE) 2MG /3ML pen-injectors has been APPROVED from 4.7.25 to 4.7.28. Ran test claim, Copay is $RTS, AS THE RX WAS LAST FILLED ON 10/29/23. This test claim was processed through [facility]- copay amounts may vary at other pharmacies due to pharmacy/plan contracts, or as the patient moves through the different stages of their insurance plan.   PA #/Case ID/Reference #: (Key: [id number])

## 2023-11-01 ENCOUNTER — Encounter: Payer: Self-pay | Admitting: Internal Medicine

## 2023-12-02 ENCOUNTER — Other Ambulatory Visit: Payer: Self-pay | Admitting: Internal Medicine

## 2023-12-31 ENCOUNTER — Other Ambulatory Visit: Payer: Self-pay | Admitting: Internal Medicine

## 2024-01-07 ENCOUNTER — Other Ambulatory Visit: Payer: Self-pay | Admitting: Internal Medicine

## 2024-01-07 DIAGNOSIS — Z1231 Encounter for screening mammogram for malignant neoplasm of breast: Secondary | ICD-10-CM

## 2024-01-14 ENCOUNTER — Ambulatory Visit
Admission: RE | Admit: 2024-01-14 | Discharge: 2024-01-14 | Discharge status: Home / Self Care | Source: Ambulatory Visit | Attending: Internal Medicine

## 2024-01-14 ENCOUNTER — Encounter: Payer: Self-pay | Admitting: Radiology

## 2024-01-14 DIAGNOSIS — Z1231 Encounter for screening mammogram for malignant neoplasm of breast: Secondary | ICD-10-CM

## 2024-01-25 ENCOUNTER — Other Ambulatory Visit: Payer: Self-pay | Admitting: Internal Medicine

## 2024-01-30 ENCOUNTER — Other Ambulatory Visit: Payer: Self-pay | Admitting: Internal Medicine

## 2024-04-28 NOTE — Patient Instructions (Addendum)
      Blood work was ordered.       Medications changes include :   increase ozempic  to 1 mg weekly with your next refill     Return in about 6 months (around 10/28/2024) for Physical Exam.

## 2024-04-28 NOTE — Progress Notes (Unsigned)
      Subjective:    Patient ID: Victoria Stout, female    DOB: 05-Feb-1966, 58 y.o.   MRN: [id number]     HPI Avanthika is here for follow up of her chronic medical problems.  obese  Medications and allergies reviewed with patient and updated if appropriate.  Current Outpatient Medications on File Prior to Visit  Medication Sig Dispense Refill   BIOTIN PO Take by mouth.     cholecalciferol (VITAMIN D3) 25 MCG (1000 UNIT) tablet Take 1,000 Units by mouth daily.     ciclopirox (PENLAC) 8 % solution Apply topically at bedtime.     cyanocobalamin  (VITAMIN B12) 1000 MCG tablet Take 1,000 mcg by mouth 3 (three) times a week.     Multiple Vitamin (MULTIVITAMIN ADULT PO) Take by mouth.     nystatin ointment (MYCOSTATIN) Apply topically at bedtime.     omeprazole (PRILOSEC) 20 MG capsule Take 20 mg by mouth daily.     rosuvastatin  (CRESTOR ) 5 MG tablet Take 1 tablet (5 mg total) by mouth daily. 90 tablet 3   Semaglutide ,0.25 or 0.5MG /DOS, (OZEMPIC , 0.25 OR 0.5 MG/DOSE,) 2 MG/3ML SOPN INJECT 0.25 MG INTO THE SKIN ONCE A WEEK FOR 30 DAYS, THEN 0.5 MG ONCE A WEEK. 9 mL 1   venlafaxine  XR (EFFEXOR -XR) 150 MG 24 hr capsule TAKE 1 CAPSULE BY MOUTH DAILY WITH BREAKFAST. 90 capsule 3   zolpidem  (AMBIEN ) 10 MG tablet TAKE 1 TABLET BY MOUTH EVERY DAY AT BEDTIME AS NEEDED FOR SLEEP 30 tablet 5   No current facility-administered medications on file prior to visit.     Review of Systems     Objective:  There were no vitals filed for this visit. BP Readings from Last 3 Encounters:  10/29/23 132/78  04/30/23 130/70  10/17/22 132/76   Wt Readings from Last 3 Encounters:  10/29/23 227 lb (103 kg)  04/30/23 232 lb (105.2 kg)  10/17/22 225 lb (102.1 kg)   There is no height or weight on file to calculate BMI.    Physical Exam     Lab Results  Component Value Date   WBC 8.3 10/29/2023   HGB 12.9 10/29/2023   HCT 38.9 10/29/2023   PLT 272.0 10/29/2023   GLUCOSE 105 (H) 10/29/2023    CHOL 178 10/29/2023   TRIG 238.0 (H) 10/29/2023   HDL 55.20 10/29/2023   LDLDIRECT 131.0 10/18/2022   LDLCALC 75 10/29/2023   ALT 37 (H) 10/29/2023   AST 31 10/29/2023   NA 142 10/29/2023   K 4.3 10/29/2023   CL 101 10/29/2023   CREATININE 0.91 10/29/2023   BUN 10 10/29/2023   CO2 31 10/29/2023   TSH 1.33 10/29/2023   HGBA1C 6.7 (H) 10/29/2023     Assessment & Plan:    See Problem List for Assessment and Plan of chronic medical problems.

## 2024-04-29 ENCOUNTER — Encounter: Payer: Self-pay | Admitting: Internal Medicine

## 2024-04-29 ENCOUNTER — Ambulatory Visit: Admitting: Internal Medicine

## 2024-04-29 VITALS — BP 130/80 | HR 100 | Temp 97.5°F | Resp 20 | Ht 64.0 in | Wt 217.2 lb

## 2024-04-29 DIAGNOSIS — R7989 Other specified abnormal findings of blood chemistry: Secondary | ICD-10-CM | POA: Diagnosis not present

## 2024-04-29 DIAGNOSIS — E785 Hyperlipidemia, unspecified: Secondary | ICD-10-CM

## 2024-04-29 DIAGNOSIS — E1169 Type 2 diabetes mellitus with other specified complication: Secondary | ICD-10-CM | POA: Diagnosis not present

## 2024-04-29 DIAGNOSIS — E66812 Obesity, class 2: Secondary | ICD-10-CM

## 2024-04-29 DIAGNOSIS — E559 Vitamin D deficiency, unspecified: Secondary | ICD-10-CM | POA: Diagnosis not present

## 2024-04-29 DIAGNOSIS — K219 Gastro-esophageal reflux disease without esophagitis: Secondary | ICD-10-CM | POA: Diagnosis not present

## 2024-04-29 DIAGNOSIS — Z6837 Body mass index (BMI) 37.0-37.9, adult: Secondary | ICD-10-CM

## 2024-04-29 DIAGNOSIS — F419 Anxiety disorder, unspecified: Secondary | ICD-10-CM

## 2024-04-29 DIAGNOSIS — Z7985 Long-term (current) use of injectable non-insulin antidiabetic drugs: Secondary | ICD-10-CM

## 2024-04-29 DIAGNOSIS — F5101 Primary insomnia: Secondary | ICD-10-CM

## 2024-04-29 LAB — COMPREHENSIVE METABOLIC PANEL WITH GFR
ALT: 29 U/L (ref 0–35)
AST: 25 U/L (ref 0–37)
Albumin: 4.6 g/dL (ref 3.5–5.2)
Alkaline Phosphatase: 94 U/L (ref 39–117)
BUN: 10 mg/dL (ref 6–23)
CO2: 31 meq/L (ref 19–32)
Calcium: 9.7 mg/dL (ref 8.4–10.5)
Chloride: 99 meq/L (ref 96–112)
Creatinine, Ser: 0.73 mg/dL (ref 0.40–1.20)
GFR: 90.25 mL/min (ref >60.00)
Glucose, Bld: 102 mg/dL — ABNORMAL HIGH (ref 70–99)
Potassium: 4.2 meq/L (ref 3.5–5.1)
Sodium: 140 meq/L (ref 135–145)
Total Bilirubin: 0.4 mg/dL (ref 0.2–1.2)
Total Protein: 7.7 g/dL (ref 6.0–8.3)

## 2024-04-29 LAB — LIPID PANEL
Cholesterol: 165 mg/dL (ref 0–200)
HDL: 60.5 mg/dL (ref >39.00)
LDL Cholesterol: 66 mg/dL (ref 0–99)
NonHDL: 104.53
Total CHOL/HDL Ratio: 3
Triglycerides: 193 mg/dL — ABNORMAL HIGH (ref 0.0–149.0)
VLDL: 38.6 mg/dL (ref 0.0–40.0)

## 2024-04-29 LAB — MICROALBUMIN / CREATININE URINE RATIO
Creatinine,U: 125 mg/dL
Microalb Creat Ratio: 6.2 mg/g (ref 0.0–30.0)
Microalb, Ur: 0.8 mg/dL (ref 0.0–1.9)

## 2024-04-29 LAB — HEMOGLOBIN A1C: Hgb A1c MFr Bld: 6.5 % (ref 4.6–6.5)

## 2024-04-29 LAB — VITAMIN B12: Vitamin B-12: 675 pg/mL (ref 211–911)

## 2024-04-29 LAB — VITAMIN D 25 HYDROXY (VIT D DEFICIENCY, FRACTURES): VITD: 24.9 ng/mL — ABNORMAL LOW (ref 30.00–100.00)

## 2024-04-29 MED ORDER — SEMAGLUTIDE (1 MG/DOSE) 4 MG/3ML ~~LOC~~ SOPN: 1.0000 mg | PEN_INJECTOR | SUBCUTANEOUS | 0 refills | Status: DC

## 2024-04-29 NOTE — Assessment & Plan Note (Signed)
Chronic Taking B12 daily and MVI Check B12 level

## 2024-04-29 NOTE — Assessment & Plan Note (Signed)
 Chronic Stressed regular exercise, healthy eating, diet high in protein, vegetables Currently on Ozempic  for diabetes-Will be increasing the dose Retiring will likely help with her weight loss efforts given the amount of stress that she is under

## 2024-04-29 NOTE — Assessment & Plan Note (Signed)
 Chronic Associated with hyperlipidemia  Lab Results  Component Value Date   HGBA1C 6.7 (H) 10/29/2023   Sugars controlled Check A1c, urine albumin/cr ratio Continue ozempic  0.5 mg weekly Stressed regular exercise, diabetic diet

## 2024-04-29 NOTE — Assessment & Plan Note (Addendum)
 Chronic With diabetes Lab Results  Component Value Date   LDLCALC 75 10/29/2023   Regular exercise and healthy diet encouraged Check lipid panel, cmp, tsh  Continue Crestor  5 mg daily

## 2024-04-29 NOTE — Assessment & Plan Note (Addendum)
 Chronic GERD fairly controlled - having some - likely from stress Continue omeprazole 20 mg daily

## 2024-04-29 NOTE — Assessment & Plan Note (Signed)
 Chronic Encouraged regular exercise Continue Ozempic , but increase to 1 mg weekly Discussed side effects with increased dose Stressed diet high in protein, decrease portions

## 2024-04-29 NOTE — Assessment & Plan Note (Signed)
 Chronic Taking vitamin D daily Check vitamin D level

## 2024-04-29 NOTE — Assessment & Plan Note (Signed)
Chronic Controlled, Stable Continue Ambien 5 mg nightly as needed-does not take nightly, but takes a few nights a week 

## 2024-04-29 NOTE — Assessment & Plan Note (Addendum)
 Chronic Increased anxiety and stress related to work-fairly controlled with medication-she will be retiring next year and that will be what typically helps Continue venlafaxine  150 mg daily

## 2024-05-01 ENCOUNTER — Ambulatory Visit: Payer: Self-pay | Admitting: Internal Medicine

## 2024-06-02 ENCOUNTER — Other Ambulatory Visit: Payer: Self-pay | Admitting: Internal Medicine

## 2024-06-02 DIAGNOSIS — E1169 Type 2 diabetes mellitus with other specified complication: Secondary | ICD-10-CM

## 2024-06-24 ENCOUNTER — Other Ambulatory Visit: Payer: Self-pay | Admitting: Internal Medicine

## 2024-07-21 ENCOUNTER — Other Ambulatory Visit: Payer: Self-pay | Admitting: Internal Medicine

## 2024-07-21 DIAGNOSIS — E1169 Type 2 diabetes mellitus with other specified complication: Secondary | ICD-10-CM

## 2024-08-22 ENCOUNTER — Other Ambulatory Visit: Payer: Self-pay | Admitting: Internal Medicine

## 2024-08-22 DIAGNOSIS — E1169 Type 2 diabetes mellitus with other specified complication: Secondary | ICD-10-CM

## 2024-10-28 ENCOUNTER — Ambulatory Visit: Admitting: Internal Medicine
